# Patient Record
Sex: Female | Born: 1970 | Race: Black or African American | Hispanic: No | Marital: Married | Smoking: Never smoker
Health system: Southern US, Community
[De-identification: ages and names within clinical notes are randomized; demographics above are authoritative.]

## PROBLEM LIST (undated history)

## (undated) DIAGNOSIS — F329 Major depressive disorder, single episode, unspecified: Secondary | ICD-10-CM

## (undated) DIAGNOSIS — F419 Anxiety disorder, unspecified: Secondary | ICD-10-CM

## (undated) DIAGNOSIS — I10 Essential (primary) hypertension: Secondary | ICD-10-CM

## (undated) DIAGNOSIS — R7303 Prediabetes: Secondary | ICD-10-CM

## (undated) DIAGNOSIS — B9681 Helicobacter pylori [H. pylori] as the cause of diseases classified elsewhere: Secondary | ICD-10-CM

## (undated) DIAGNOSIS — K279 Peptic ulcer, site unspecified, unspecified as acute or chronic, without hemorrhage or perforation: Secondary | ICD-10-CM

## (undated) DIAGNOSIS — F32A Depression, unspecified: Secondary | ICD-10-CM

## (undated) HISTORY — PX: OOPHORECTOMY: SHX86

## (undated) HISTORY — PX: CHOLECYSTECTOMY: SHX55

## (undated) HISTORY — PX: ABDOMINAL HYSTERECTOMY: SHX81

## (undated) HISTORY — PX: APPENDECTOMY: SHX54

---

## 2006-12-30 ENCOUNTER — Inpatient Hospital Stay (HOSPITAL_COMMUNITY): Admission: EM | Admit: 2006-12-30 | Discharge: 2006-12-31 | Payer: Self-pay | Admitting: Emergency Medicine

## 2008-07-20 IMAGING — CR DG CHEST 1V PORT
1 series · 1 of 1 positions shown · non-contrast
Comparison: None available.

CLINICAL DATA: Chest pain and shortness of breath.
 PORTABLE CHEST - 1 VIEW 12/30/06:

[view not recorded]
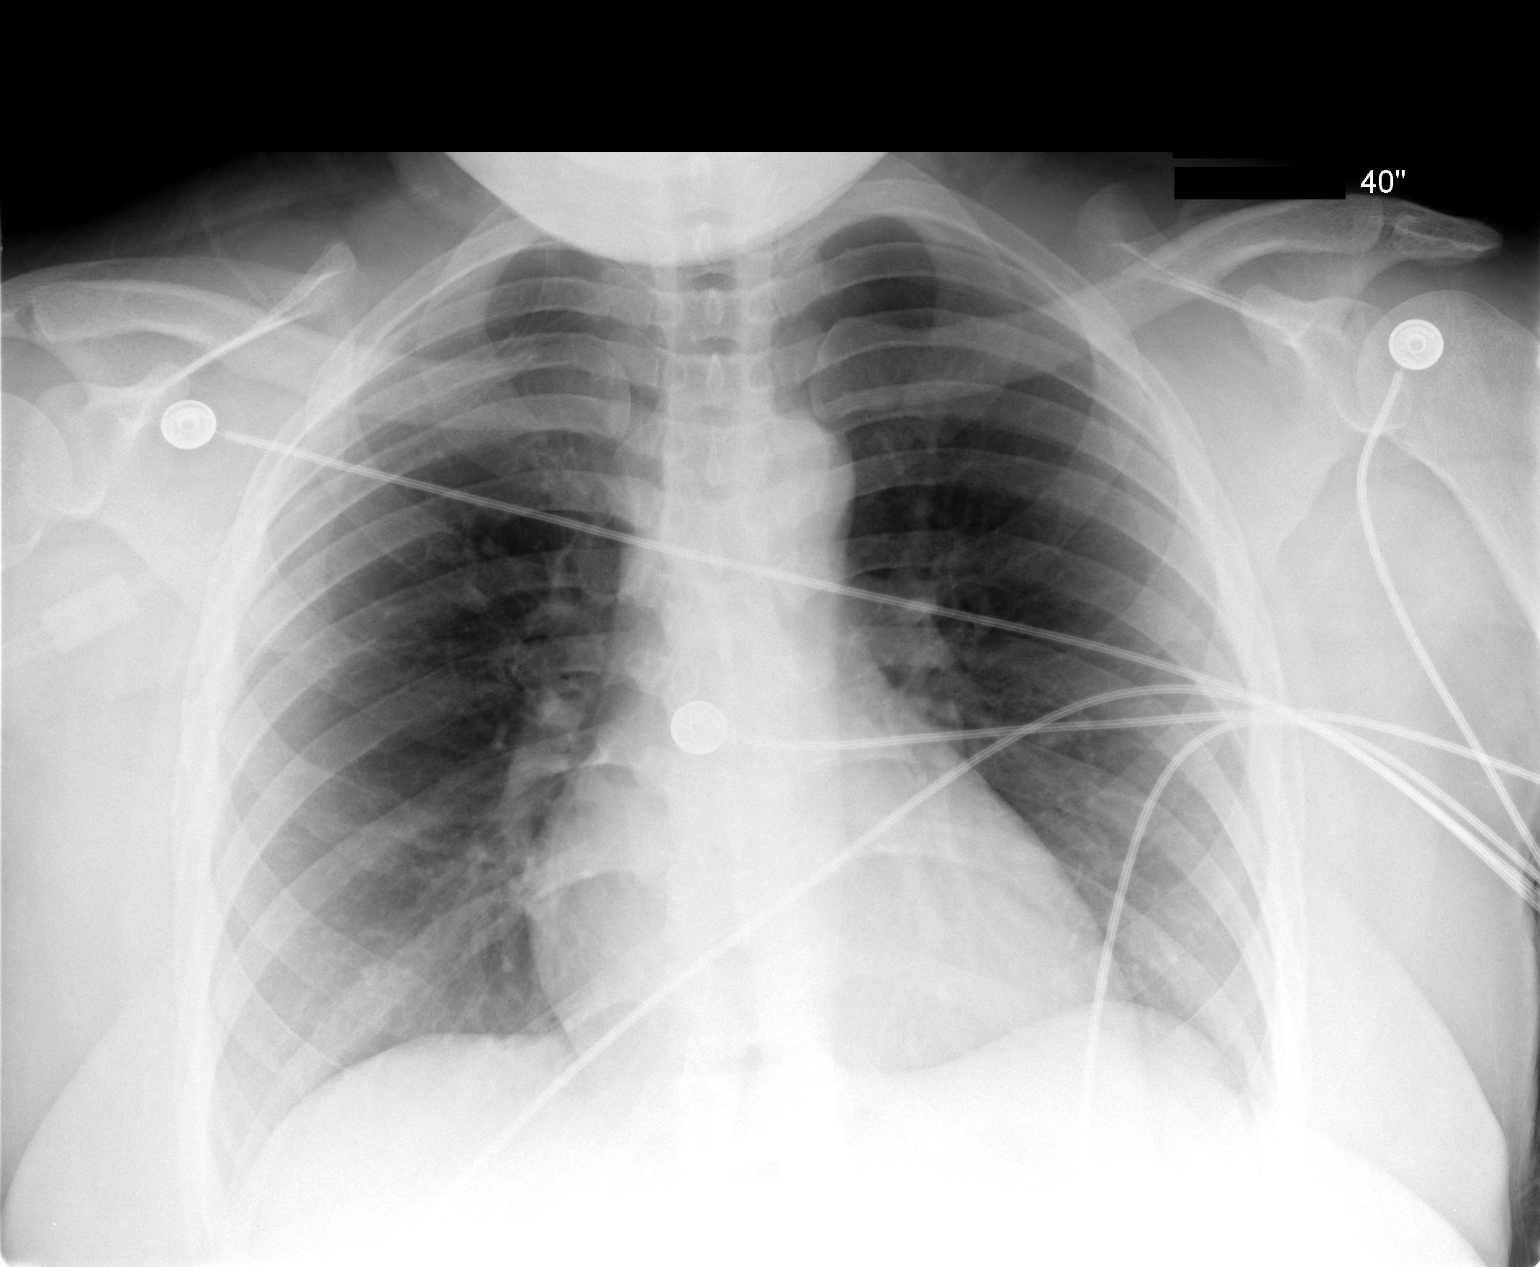

[1 of 1 positions shown; findings below may reference images not displayed]

FINDINGS: Trachea is midline.  Heart size is accentuated by technique.  Lungs are clear.  No pneumothorax.  No pleural fluid.
IMPRESSION: No acute findings.

## 2009-01-17 ENCOUNTER — Ambulatory Visit: Payer: Self-pay | Admitting: Cardiology

## 2009-01-17 ENCOUNTER — Observation Stay (HOSPITAL_COMMUNITY): Admission: EM | Admit: 2009-01-17 | Discharge: 2009-01-18 | Payer: Self-pay | Admitting: Emergency Medicine

## 2009-01-18 ENCOUNTER — Encounter: Payer: Self-pay | Admitting: Cardiology

## 2009-05-10 ENCOUNTER — Emergency Department (HOSPITAL_COMMUNITY)
Admission: EM | Admit: 2009-05-10 | Discharge: 2009-05-11 | Payer: Self-pay | Source: Home / Self Care | Admitting: Emergency Medicine

## 2009-09-18 ENCOUNTER — Ambulatory Visit (HOSPITAL_COMMUNITY): Admission: RE | Admit: 2009-09-18 | Discharge: 2009-09-18 | Payer: Self-pay | Admitting: Cardiology

## 2010-04-12 LAB — HM HEPATITIS C SCREENING LAB: HM Hepatitis Screen: NEGATIVE

## 2010-05-07 ENCOUNTER — Emergency Department (HOSPITAL_COMMUNITY)
Admission: EM | Admit: 2010-05-07 | Discharge: 2010-05-07 | Payer: Self-pay | Source: Home / Self Care | Admitting: Emergency Medicine

## 2010-07-14 LAB — DIFFERENTIAL
Basophils Absolute: 0 K/uL (ref 0.0–0.1)
Basophils Relative: 0 % (ref 0–1)
Eosinophils Absolute: 0 K/uL (ref 0.0–0.7)
Eosinophils Relative: 1 % (ref 0–5)
Lymphocytes Relative: 21 % (ref 12–46)
Lymphs Abs: 1 K/uL (ref 0.7–4.0)
Monocytes Absolute: 0.3 K/uL (ref 0.1–1.0)
Monocytes Relative: 5 % (ref 3–12)
Neutro Abs: 3.6 K/uL (ref 1.7–7.7)
Neutrophils Relative %: 72 % (ref 43–77)

## 2010-07-14 LAB — CBC
HCT: 35.9 % — ABNORMAL LOW (ref 36.0–46.0)
Hemoglobin: 12.1 g/dL (ref 12.0–15.0)
MCHC: 33.8 g/dL (ref 30.0–36.0)
MCV: 90.2 fL (ref 78.0–100.0)
Platelets: 279 K/uL (ref 150–400)
RBC: 3.98 MIL/uL (ref 3.87–5.11)
RDW: 13.5 % (ref 11.5–15.5)
WBC: 4.9 K/uL (ref 4.0–10.5)

## 2010-07-14 LAB — POCT I-STAT, CHEM 8
BUN: 9 mg/dL (ref 6–23)
Calcium, Ion: 1.14 mmol/L (ref 1.12–1.32)
Chloride: 105 meq/L (ref 96–112)
Creatinine, Ser: 0.7 mg/dL (ref 0.4–1.2)
Glucose, Bld: 95 mg/dL (ref 70–99)
HCT: 40 % (ref 36.0–46.0)
Hemoglobin: 13.6 g/dL (ref 12.0–15.0)
Potassium: 3.9 meq/L (ref 3.5–5.1)
Sodium: 139 meq/L (ref 135–145)
TCO2: 28 mmol/L (ref 0–100)

## 2010-07-14 LAB — POCT CARDIAC MARKERS
CKMB, poc: <1 ng/mL — ABNORMAL LOW (ref 1.0–8.0)
Myoglobin, poc: 36.8 ng/mL (ref 12–200)
Troponin i, poc: <0.05 ng/mL (ref 0.00–0.09)

## 2010-07-14 LAB — D-DIMER, QUANTITATIVE

## 2010-08-02 LAB — CBC
HCT: 37.4 % (ref 36.0–46.0)
MCV: 91.6 fL (ref 78.0–100.0)
Platelets: 298 10*3/uL (ref 150–400)
RDW: 13.2 % (ref 11.5–15.5)
WBC: 7 10*3/uL (ref 4.0–10.5)

## 2010-08-02 LAB — COMPREHENSIVE METABOLIC PANEL
ALT: 38 U/L — ABNORMAL HIGH (ref 0–35)
AST: 28 U/L (ref 0–37)
CO2: 28 mEq/L (ref 19–32)
Calcium: 9.7 mg/dL (ref 8.4–10.5)
Creatinine, Ser: 0.77 mg/dL (ref 0.4–1.2)
GFR calc Af Amer: >60 mL/min (ref >60)
Total Bilirubin: 0.6 mg/dL (ref 0.3–1.2)
Total Protein: 7.4 g/dL (ref 6.0–8.3)

## 2010-08-02 LAB — POCT CARDIAC MARKERS
CKMB, poc: 1.2 ng/mL (ref 1.0–8.0)
Myoglobin, poc: 60.6 ng/mL (ref 12–200)
Troponin i, poc: <0.05 ng/mL (ref 0.00–0.09)

## 2010-08-02 LAB — DIFFERENTIAL
Basophils Absolute: 0.1 10*3/uL (ref 0.0–0.1)
Basophils Relative: 1 % (ref 0–1)
Eosinophils Absolute: 0.2 10*3/uL (ref 0.0–0.7)
Eosinophils Relative: 3 % (ref 0–5)
Monocytes Absolute: 0.5 10*3/uL (ref 0.1–1.0)

## 2010-08-02 LAB — TROPONIN I: Troponin I: 0.02 ng/mL (ref 0.00–0.06)

## 2010-08-02 LAB — URINALYSIS, ROUTINE W REFLEX MICROSCOPIC
Bilirubin Urine: NEGATIVE
Hgb urine dipstick: NEGATIVE
Ketones, ur: NEGATIVE mg/dL
Specific Gravity, Urine: 1.018 (ref 1.005–1.030)
Urobilinogen, UA: 0.2 mg/dL (ref 0.0–1.0)
pH: 6 (ref 5.0–8.0)

## 2010-08-02 LAB — CK TOTAL AND CKMB (NOT AT ARMC)
CK, MB: 1.2 ng/mL (ref 0.3–4.0)
CK, MB: 1.3 ng/mL (ref 0.3–4.0)
Relative Index: 1 (ref 0.0–2.5)
Relative Index: INVALID (ref 0.0–2.5)
Total CK: 108 U/L (ref 7–177)
Total CK: 117 U/L (ref 7–177)
Total CK: 97 U/L (ref 7–177)

## 2010-09-10 NOTE — H&P (Signed)
Deanna Howe, THOMURE NO.:  [id number]   MEDICAL RECORD NO.:  [id number]          PATIENT TYPE:  EMS   LOCATION:  ED                           FACILITY:  [facility]   PHYSICIAN:  Elliot Cousin, M.D.    DATE OF BIRTH:  1971-03-17   DATE OF ADMISSION:  12/30/2006  DATE OF DISCHARGE:                              HISTORY & PHYSICAL   PRIMARY CARE PHYSICIAN:  Patient is unassigned.   CHIEF COMPLAINT:  Chest pressure and heart racing.   HISTORY OF PRESENT ILLNESS:  Patient is a 40 year old woman with a  remote history of hypertension and gastroesophageal reflux disease, who  presents to the emergency department with a chief complaint of chest  pressure and  heart racing.  Her symptoms started two days ago.  Over  the past 24 hours, the chest pressure has been constant.  It is located  substernally and over her central chest.  She rates the pain as an 8/10  in intensity.  It is associated with a feeling of her heart racing.  She  acknowledges chest palpitations.  The palpitations are worse when she  walks.  They resolve when she sits.  Occasionally, she has palpitations  when she is at rest.  The chest pressure is associated with occasional  shortness of breath. No associated radiation of the pressure and no  associated nausea or diaphoresis.  The patient denies pleurisy.  She has  mild chronic swelling in her ankles.  She denies any recent heavy  lifting.  She does acknowledge that she has occasional heartburn.  She  has had no cold-like symptoms or chronic cough.  She denies taking any  over-the-counter supplements.  She denies drinking excessive amounts of  coffee.  She denies illicit drug use.   The patient was given two sublingual nitroglycerin in the emergency  department, and it decreased her pain from an 8/10 in intensity to a  0/10 in intensity.  She is currently chest painfree.  She is  hemodynamically stable.  Her initial cardiac markers are negative.   Her  EKG reveals a sinus rhythm with occasional PVCs with a heart rate of  79 beats per minute.  The patient will be admitted for further  evaluation and management.   PAST MEDICAL HISTORY:  1. Treated for hypertension with atenolol from 2004 to 2007.  The      patient stopped atenolol because her blood pressure was normal.  2. Gastroesophageal reflux disease.  3. Status post C-sections x3.  4. Status post partial hysterectomy secondary to fibroids.  5. Status post right oophorectomy and status post appendectomy in      2006.  6. Status post left ovarian cyst excision.  7. Status post cholecystectomy in 2005.   MEDICATIONS:  None.   ALLERGIES:  The patient has allergies to MORPHINE, CODEINE, DILAUDID,  HYDROCODONE, OXYCODONE, PENICILLIN, and LATEX.   SOCIAL HISTORY:  The patient moved from [place], [place]  recently.  She is divorced.  She has three children.  She is currently  seeking employment.  She is trained as a Lawyer and  phlebotomist.  She  denies tobacco, alcohol, and illicit drug use.   FAMILY HISTORY:  Her mother is 30 years of age and has some type of  heart disease.  She also has a history of hypertension and diabetes  mellitus.  Her father is 67 years of age and has diabetes mellitus and  hypertension.   REVIEW OF SYSTEMS:  The patient's review of systems is positive for  occasional swelling in her ankles and heartburn; otherwise review of  systems is negative.   PHYSICAL EXAMINATION:  Temperature 97.8, blood pressure 118/75, pulse  75, respiratory rate 18, oxygen saturation 99% on room air.  GENERAL:  The patient is a pleasant 40 year old mildly obese African-  American woman who is currently sitting up in bed in no acute distress.  HEENT:  Head is normocephalic and atraumatic.  Pupils are equal, round  and reactive to light.  Extraocular movements are intact.  Conjunctivae  are clear.  Sclerae are white.  Tympanic membranes are clear  bilaterally.  Nasal mucosa  is mildly dry.  No sinus tenderness.  Oropharynx reveals good dentition.  Mucous membranes are mildly dry.  No  posterior exudates or erythema.  NECK:  Supple.  No adenopathy.  No thyromegaly.  No bruit.  No JVD.  LUNGS:  Clear to auscultation bilaterally.  HEART:  S1 and S2 with an occasional ectopic beat.  ABDOMEN:  Well-healed abdominal scar.  Mildly obese.  Positive bowel  sounds.  Soft, nontender, nondistended.  No hepatosplenomegaly.  No  masses palpated.  EXTREMITIES:  Pedal pulses palpable bilaterally.  No pretibial edema, no  pedal edema.  NEUROLOGIC:  Patient is alert and oriented x3.  Cranial nerves II-XII  are intact.   ADMISSION LABORATORIES:  Troponin I less than 0.05.  Myoglobin 44.8.  CK-  MB less than 1.  Sodium 138, potassium 3.7, chloride 106, CO2 28,  glucose 106, BUN 4, creatinine 0.70, calcium 9.  WBC 7.3, hemoglobin  11.8, platelets 351.  D-dimer 0.46.  Urine pregnancy test negative.  Urinalysis within normal limits.   ASSESSMENT:  1. Chest pressure with palpitations:  The patient probably does not      have underlying coronary artery disease; however, she will be      admitted to rule out myocardial infarction.  She will also be      evaluated with a 2D echocardiogram to rule out mitral valve      prolapse.  2. History of hypertension:  The patient's blood pressure is well      within normal limits.  However, given the history of present      illness and evidence of premature ventricular contractions on the      EKG, we will start atenolol empirically.  3. Mild normocytic anemia.  4. History of gastroesophageal reflux disease.   PLAN:  1. The patient will be admitted for further evaluation and management.  2. We will restart atenolol at 25 mg daily.  3. Will discontinue nitro paste as started by the emergency department      physician and start sublingual nitroglycerin as needed (The patient      requested that the nitroglycerin paste be taken off because  of a      moderate      headache).  4. Will start an aspirin, Protonix, and Mylanta.  5. Will check cardiac enzymes, TSH, 2D echocardiogram, and an urine      drug screen.      Elliot Cousin, M.D.  Electronically Signed     DF/MEDQ  D:  12/30/2006  T:  12/30/2006  Job:  [id number]

## 2010-09-10 NOTE — Discharge Summary (Signed)
Deanna Howe, Deanna Howe NO.:  [id number]   MEDICAL RECORD NO.:  [id number]          PATIENT TYPE:  INP   LOCATION:  1415                         FACILITY:  [facility]   PHYSICIAN:  Michaelyn Barter, M.D. DATE OF BIRTH:  September 30, 1970   DATE OF ADMISSION:  12/30/2006  DATE OF DISCHARGE:                               DISCHARGE SUMMARY   The patient's primary care doctor is unassigned.   FINAL DIAGNOSES:  1. Chest pain, atypical.  2. Palpitations.  3. Hypertension.   PROCEDURES:  1. Chest x-ray.  2. 2-D echocardiogram completed December 31, 2006.   HISTORY OF PRESENT ILLNESS:  Deanna Howe is a 40 year old female with a  history of hypertension and GERD.  She arrived with a chief complaint of  chest pressure and felt her heart racing.  She stated that her symptoms  started approximately 2 days prior to admission.  She indicates that  chest pressure has been constant and located substernally over the  central region of her chest pain.  She also stated that there was a  pain.  She rated it initially at an 8/10 in intensity.  She indicated  that she felt chest palpitations, which were worse when she walked and  resolved when she sat down.  She indicated that she had some occasional  shortness of breath.  There was no radiation of the pressure and no  associated diaphoresis.   PAST MEDICAL HISTORY:  Please see that dictated by Dr. Elliot Cousin.   HOSPITAL COURSE:  Problem 1.  CHEST PRESSURE:  After talking to the patient further, her  symptoms sound somewhat atypical in their description.  A chest x-ray  was completed on December 30, 2006, that revealed no acute findings.  The patient's EKG reveals sinus rhythm with occasional PVCs.  She was  admitted to the telemetry floor cardiac.  Her cardiac markers were  cycled.  Her troponin I were 0.02, 0.03 and 0.04.  Her CK-MBs were 1.0,  0.7 and 0.8,  By the day following her hospitalization she indicated  that her chest pain  had resolved.  She did have some slight chest  pressure.  Likewise, she indicated that she was no longer having any  palpitations.  I discussed setting the patient up for a stress test.  The patient refused to have a stress test.  She indicated that she had a  stress test approximately 3 years ago in [place], [place], and  that was normal.   Problem 2.  HYPERTENSION:  The patient's blood pressure has been  normotensive.  Atenolol was started.   CONDITION AT TIME OF DISCHARGE:  Currently the patient states that she  feels better.  Her temperature is 98.1.  Her heart rate 65, her  respirations 20, blood pressure 117/62, O2 saturation 96% on room air.  She looks otherwise comfortable.  She states that she wants to be  discharged home.  Again, I have offered to arrange the  patient to have a stress test even as an outpatient.  However, the  patient states that she does not want to have a  stress test.  Therefore,  the decision has been made to discharge the patient home as she has  requested.  She will be told to call [facility] in light of her not  having a primary care doctor to arrange for a follow-up appointment.      Michaelyn Barter, M.D.  Electronically Signed     OR/MEDQ  D:  12/31/2006  T:  01/01/2007  Job:  [id number]

## 2011-02-07 LAB — RAPID URINE DRUG SCREEN, HOSP PERFORMED
Amphetamines: NOT DETECTED
Benzodiazepines: NOT DETECTED
Cocaine: NOT DETECTED

## 2011-02-07 LAB — CK TOTAL AND CKMB (NOT AT ARMC)
Relative Index: 0.8
Relative Index: INVALID

## 2011-02-07 LAB — DIFFERENTIAL
Basophils Relative: 0
Eosinophils Relative: 3
Lymphocytes Relative: 31
Lymphs Abs: 2.2
Monocytes Relative: 9
Neutro Abs: 4.2
Neutrophils Relative %: 57

## 2011-02-07 LAB — URINALYSIS, ROUTINE W REFLEX MICROSCOPIC
Bilirubin Urine: NEGATIVE
Glucose, UA: NEGATIVE
Hgb urine dipstick: NEGATIVE
Ketones, ur: NEGATIVE
pH: 6

## 2011-02-07 LAB — BASIC METABOLIC PANEL
BUN: 4 — ABNORMAL LOW
CO2: 28
Calcium: 9
GFR calc Af Amer: >60
GFR calc non Af Amer: >60
Glucose, Bld: 106 — ABNORMAL HIGH

## 2011-02-07 LAB — B-NATRIURETIC PEPTIDE (CONVERTED LAB): Pro B Natriuretic peptide (BNP): <30

## 2011-02-07 LAB — CBC
HCT: 34.2 — ABNORMAL LOW
MCV: 88.5

## 2011-02-07 LAB — POCT CARDIAC MARKERS: Operator id: 4708

## 2011-02-07 LAB — TROPONIN I
Troponin I: 0.02
Troponin I: 0.03

## 2011-02-07 LAB — D-DIMER, QUANTITATIVE: D-Dimer, Quant: 0.46

## 2011-02-07 LAB — POCT PREGNANCY, URINE: Preg Test, Ur: NEGATIVE

## 2011-05-02 ENCOUNTER — Other Ambulatory Visit: Payer: Self-pay | Admitting: Obstetrics and Gynecology

## 2011-05-02 DIAGNOSIS — N6320 Unspecified lump in the left breast, unspecified quadrant: Secondary | ICD-10-CM

## 2011-05-02 DIAGNOSIS — N644 Mastodynia: Secondary | ICD-10-CM

## 2011-05-08 ENCOUNTER — Ambulatory Visit
Admission: RE | Admit: 2011-05-08 | Discharge: 2011-05-08 | Disposition: A | Discharge status: Home / Self Care | Payer: Managed Care, Other (non HMO) | Source: Ambulatory Visit | Attending: Obstetrics and Gynecology | Admitting: Obstetrics and Gynecology

## 2011-05-08 DIAGNOSIS — N6320 Unspecified lump in the left breast, unspecified quadrant: Secondary | ICD-10-CM

## 2011-05-08 DIAGNOSIS — N644 Mastodynia: Secondary | ICD-10-CM

## 2011-06-10 ENCOUNTER — Encounter (HOSPITAL_COMMUNITY): Payer: Self-pay

## 2011-06-10 ENCOUNTER — Emergency Department (HOSPITAL_COMMUNITY)
Admission: EM | Admit: 2011-06-10 | Discharge: 2011-06-10 | Discharge status: Against Medical Advice | Payer: Managed Care, Other (non HMO) | Attending: Emergency Medicine | Admitting: Emergency Medicine

## 2011-06-10 ENCOUNTER — Other Ambulatory Visit: Payer: Self-pay

## 2011-06-10 DIAGNOSIS — R079 Chest pain, unspecified: Secondary | ICD-10-CM | POA: Insufficient documentation

## 2011-06-10 HISTORY — DX: Anxiety disorder, unspecified: F41.9

## 2011-06-10 HISTORY — DX: Essential (primary) hypertension: I10

## 2011-06-10 NOTE — ED Notes (Signed)
C/o pressure to LT chest, behind breast radiating to LT shoulder and numbness to LT arm.  Denies shob but was nauseated earlier today.  Skin warm and dry.  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

## 2011-06-10 NOTE — ED Notes (Signed)
Pt refusing to stay any longer. Pt signed AMA form after being informed of the risks of leaving and the benefits of staying. Pt aware of these and signed AMA. Pt in no apparent distress upon leaving and alert and oriented. Pt encouraged to return if she needed to.

## 2012-08-20 ENCOUNTER — Encounter (HOSPITAL_COMMUNITY): Payer: Self-pay | Admitting: Pharmacist

## 2012-08-26 ENCOUNTER — Inpatient Hospital Stay (HOSPITAL_COMMUNITY): Admission: RE | Admit: 2012-08-26 | Payer: Managed Care, Other (non HMO) | Source: Ambulatory Visit

## 2012-09-01 ENCOUNTER — Other Ambulatory Visit: Payer: Self-pay | Admitting: Obstetrics and Gynecology

## 2012-09-01 ENCOUNTER — Encounter (HOSPITAL_COMMUNITY)
Admission: RE | Admit: 2012-09-01 | Discharge: 2012-09-01 | Disposition: A | Discharge status: Home / Self Care | Payer: Managed Care, Other (non HMO) | Source: Ambulatory Visit | Attending: Obstetrics and Gynecology | Admitting: Obstetrics and Gynecology

## 2012-09-01 ENCOUNTER — Encounter (HOSPITAL_COMMUNITY): Payer: Self-pay

## 2012-09-01 DIAGNOSIS — R109 Unspecified abdominal pain: Secondary | ICD-10-CM

## 2012-09-01 HISTORY — DX: Peptic ulcer, site unspecified, unspecified as acute or chronic, without hemorrhage or perforation: K27.9

## 2012-09-01 HISTORY — DX: Depression, unspecified: F32.A

## 2012-09-01 HISTORY — DX: Major depressive disorder, single episode, unspecified: F32.9

## 2012-09-01 HISTORY — DX: Helicobacter pylori (H. pylori) as the cause of diseases classified elsewhere: B96.81

## 2012-09-01 LAB — BASIC METABOLIC PANEL
BUN: 7 mg/dL (ref 6–23)
Chloride: 103 mEq/L (ref 96–112)
Glucose, Bld: 101 mg/dL — ABNORMAL HIGH (ref 70–99)
Potassium: 4.2 mEq/L (ref 3.5–5.1)

## 2012-09-01 LAB — CBC
HCT: 37.7 % (ref 36.0–46.0)
Hemoglobin: 12.6 g/dL (ref 12.0–15.0)
MCHC: 33.4 g/dL (ref 30.0–36.0)
WBC: 5.5 10*3/uL (ref 4.0–10.5)

## 2012-09-01 NOTE — Patient Instructions (Addendum)
20 Deanna Howe  09/01/2012   Your procedure is scheduled on:  09/06/12  Enter through the Main Entrance of [facility] at 6 AM.  Pick up the phone at the desk and dial [phone].   Call this number if you have problems the morning of surgery: [phone]   Remember:   Do not eat food:After Midnight.  Do not drink clear liquids: After Midnight.  Take these medicines the morning of surgery with A SIP OF WATER: Celexa, Wellbutrin, blood pressure medication.   Do not wear jewelry, make-up or nail polish.  Do not wear lotions, powders, or perfumes. You may wear deodorant.  Do not shave 48 hours prior to surgery.  Do not bring valuables to the hospital.  Contacts, dentures or bridgework may not be worn into surgery.  Leave suitcase in the car. After surgery it may be brought to your room.  For patients admitted to the hospital, checkout time is 11:00 AM the day of discharge.   Patients discharged the day of surgery will not be allowed to drive home.  Name and phone number of your driver: NA  Special Instructions: Shower using CHG 2 nights before surgery and the night before surgery.  If you shower the day of surgery use CHG.  Use special wash - you have one bottle of CHG for all showers.  You should use approximately 1/3 of the bottle for each shower.   Please read over the following fact sheets that you were given: MRSA Information

## 2012-09-02 ENCOUNTER — Ambulatory Visit
Admission: RE | Admit: 2012-09-02 | Discharge: 2012-09-02 | Disposition: A | Discharge status: Home / Self Care | Payer: Managed Care, Other (non HMO) | Source: Ambulatory Visit | Attending: Obstetrics and Gynecology | Admitting: Obstetrics and Gynecology

## 2012-09-02 DIAGNOSIS — R109 Unspecified abdominal pain: Secondary | ICD-10-CM

## 2012-09-06 ENCOUNTER — Encounter (HOSPITAL_COMMUNITY)
Admission: RE | Disposition: A | Discharge status: Home / Self Care | Payer: Self-pay | Source: Ambulatory Visit | Attending: Obstetrics and Gynecology

## 2012-09-06 ENCOUNTER — Encounter (HOSPITAL_COMMUNITY): Payer: Self-pay | Admitting: Anesthesiology

## 2012-09-06 ENCOUNTER — Ambulatory Visit (HOSPITAL_COMMUNITY)
Admission: RE | Admit: 2012-09-06 | Discharge: 2012-09-06 | Disposition: A | Discharge status: Home / Self Care | Payer: Managed Care, Other (non HMO) | Source: Ambulatory Visit | Attending: Obstetrics and Gynecology | Admitting: Obstetrics and Gynecology

## 2012-09-06 ENCOUNTER — Ambulatory Visit (HOSPITAL_COMMUNITY): Payer: Managed Care, Other (non HMO) | Admitting: Anesthesiology

## 2012-09-06 DIAGNOSIS — I10 Essential (primary) hypertension: Secondary | ICD-10-CM | POA: Insufficient documentation

## 2012-09-06 DIAGNOSIS — R1032 Left lower quadrant pain: Secondary | ICD-10-CM | POA: Insufficient documentation

## 2012-09-06 DIAGNOSIS — N949 Unspecified condition associated with female genital organs and menstrual cycle: Secondary | ICD-10-CM | POA: Insufficient documentation

## 2012-09-06 DIAGNOSIS — N83209 Unspecified ovarian cyst, unspecified side: Secondary | ICD-10-CM | POA: Insufficient documentation

## 2012-09-06 HISTORY — PX: INCISION AND DRAINAGE: SHX5863

## 2012-09-06 HISTORY — PX: LAPAROSCOPY: SHX197

## 2012-09-06 SURGERY — LAPAROSCOPY OPERATIVE
Anesthesia: General | Site: Abdomen | Wound class: Clean Contaminated

## 2012-09-06 MED ORDER — DEXAMETHASONE SODIUM PHOSPHATE 10 MG/ML IJ SOLN
INTRAMUSCULAR | Status: DC | PRN
  Administered 2012-09-06: 10 mg via INTRAVENOUS

## 2012-09-06 MED ORDER — ACETAMINOPHEN 160 MG/5ML PO SOLN: 975.0000 mg | Freq: Once | ORAL | Status: DC

## 2012-09-06 MED ORDER — KETOROLAC TROMETHAMINE 30 MG/ML IJ SOLN
INTRAMUSCULAR | Status: DC | PRN
  Administered 2012-09-06: 30 mg via INTRAVENOUS

## 2012-09-06 MED ORDER — LACTATED RINGERS IR SOLN
Status: DC | PRN
  Administered 2012-09-06: 3000 mL

## 2012-09-06 MED ORDER — SCOPOLAMINE 1 MG/3DAYS TD PT72: 1.0000 | MEDICATED_PATCH | TRANSDERMAL | Status: DC

## 2012-09-06 MED ORDER — MIDAZOLAM HCL 2 MG/2ML IJ SOLN
INTRAMUSCULAR | Status: AC
  Filled 2012-09-06: qty 2

## 2012-09-06 MED ORDER — GLYCOPYRROLATE 0.2 MG/ML IJ SOLN
INTRAMUSCULAR | Status: AC
  Filled 2012-09-06: qty 3

## 2012-09-06 MED ORDER — FENTANYL CITRATE 0.05 MG/ML IJ SOLN
INTRAMUSCULAR | Status: DC | PRN
  Administered 2012-09-06 (×5): 50 ug via INTRAVENOUS

## 2012-09-06 MED ORDER — METOCLOPRAMIDE HCL 5 MG/ML IJ SOLN: 10.0000 mg | Freq: Once | INTRAMUSCULAR | Status: DC | PRN

## 2012-09-06 MED ORDER — NEOSTIGMINE METHYLSULFATE 1 MG/ML IJ SOLN
INTRAMUSCULAR | Status: AC
  Filled 2012-09-06: qty 1

## 2012-09-06 MED ORDER — FENTANYL CITRATE 0.05 MG/ML IJ SOLN
25.0000 ug | INTRAMUSCULAR | Status: DC | PRN
  Administered 2012-09-06: 50 ug via INTRAVENOUS

## 2012-09-06 MED ORDER — BUPIVACAINE HCL (PF) 0.25 % IJ SOLN
INTRAMUSCULAR | Status: AC
  Filled 2012-09-06: qty 30

## 2012-09-06 MED ORDER — NEOSTIGMINE METHYLSULFATE 1 MG/ML IJ SOLN
INTRAMUSCULAR | Status: DC | PRN
  Administered 2012-09-06: 5 mg via INTRAVENOUS

## 2012-09-06 MED ORDER — SCOPOLAMINE 1 MG/3DAYS TD PT72
MEDICATED_PATCH | TRANSDERMAL | Status: AC
  Administered 2012-09-06: 1.5 mg via TRANSDERMAL
  Filled 2012-09-06: qty 1

## 2012-09-06 MED ORDER — GLYCOPYRROLATE 0.2 MG/ML IJ SOLN
INTRAMUSCULAR | Status: DC | PRN
  Administered 2012-09-06: .8 mg via INTRAVENOUS

## 2012-09-06 MED ORDER — FENTANYL CITRATE 0.05 MG/ML IJ SOLN
INTRAMUSCULAR | Status: AC
  Administered 2012-09-06: 50 ug via INTRAVENOUS
  Filled 2012-09-06: qty 2

## 2012-09-06 MED ORDER — PROPOFOL 10 MG/ML IV EMUL
INTRAVENOUS | Status: AC
  Filled 2012-09-06: qty 20

## 2012-09-06 MED ORDER — ONDANSETRON HCL 4 MG/2ML IJ SOLN
INTRAMUSCULAR | Status: DC | PRN
  Administered 2012-09-06: 4 mg via INTRAVENOUS

## 2012-09-06 MED ORDER — DIPHENHYDRAMINE HCL 50 MG/ML IJ SOLN
INTRAMUSCULAR | Status: DC | PRN
  Administered 2012-09-06: 50 mg via INTRAVENOUS

## 2012-09-06 MED ORDER — MIDAZOLAM HCL 5 MG/5ML IJ SOLN
INTRAMUSCULAR | Status: DC | PRN
  Administered 2012-09-06: 2 mg via INTRAVENOUS

## 2012-09-06 MED ORDER — ACETAMINOPHEN 160 MG/5 ML PO SOLN
975.0000 mg | Freq: Four times a day (QID) | ORAL | Status: DC | PRN
  Filled 2012-09-06: qty 30.4

## 2012-09-06 MED ORDER — DEXAMETHASONE SODIUM PHOSPHATE 10 MG/ML IJ SOLN
INTRAMUSCULAR | Status: AC
  Filled 2012-09-06: qty 1

## 2012-09-06 MED ORDER — ACETAMINOPHEN 10 MG/ML IV SOLN: 1000.0000 mg | Freq: Once | INTRAVENOUS | Status: AC | PRN

## 2012-09-06 MED ORDER — FENTANYL CITRATE 0.05 MG/ML IJ SOLN
INTRAMUSCULAR | Status: AC
  Filled 2012-09-06: qty 5

## 2012-09-06 MED ORDER — NALBUPHINE SYRINGE 5 MG/0.5 ML
INJECTION | INTRAMUSCULAR | Status: AC
  Filled 2012-09-06: qty 0.5

## 2012-09-06 MED ORDER — KETOROLAC TROMETHAMINE 30 MG/ML IJ SOLN
INTRAMUSCULAR | Status: AC
  Filled 2012-09-06: qty 1

## 2012-09-06 MED ORDER — LACTATED RINGERS IV SOLN
INTRAVENOUS | Status: DC
  Administered 2012-09-06 (×3): via INTRAVENOUS

## 2012-09-06 MED ORDER — GLYCOPYRROLATE 0.2 MG/ML IJ SOLN
INTRAMUSCULAR | Status: AC
  Filled 2012-09-06: qty 1

## 2012-09-06 MED ORDER — IBUPROFEN 200 MG PO TABS: 600.0000 mg | ORAL_TABLET | Freq: Four times a day (QID) | ORAL | Status: DC | PRN

## 2012-09-06 MED ORDER — LIDOCAINE HCL (CARDIAC) 20 MG/ML IV SOLN
INTRAVENOUS | Status: DC | PRN
  Administered 2012-09-06: 80 mg via INTRAVENOUS

## 2012-09-06 MED ORDER — BUPIVACAINE HCL (PF) 0.25 % IJ SOLN
INTRAMUSCULAR | Status: DC | PRN
  Administered 2012-09-06: 2 mL

## 2012-09-06 MED ORDER — ACETAMINOPHEN 160 MG/5ML PO SOLN
ORAL | Status: AC
  Administered 2012-09-06: 975 mg via ORAL
  Filled 2012-09-06: qty 20.3

## 2012-09-06 MED ORDER — ROCURONIUM BROMIDE 50 MG/5ML IV SOLN
INTRAVENOUS | Status: AC
  Filled 2012-09-06: qty 1

## 2012-09-06 MED ORDER — ROCURONIUM BROMIDE 100 MG/10ML IV SOLN
INTRAVENOUS | Status: DC | PRN
  Administered 2012-09-06: 50 mg via INTRAVENOUS

## 2012-09-06 MED ORDER — KETOROLAC TROMETHAMINE 30 MG/ML IJ SOLN: 15.0000 mg | Freq: Once | INTRAMUSCULAR | Status: DC | PRN

## 2012-09-06 MED ORDER — ACETAMINOPHEN 10 MG/ML IV SOLN
INTRAVENOUS | Status: AC
  Administered 2012-09-06: 1000 mg via INTRAVENOUS
  Filled 2012-09-06: qty 100

## 2012-09-06 MED ORDER — PROPOFOL 10 MG/ML IV BOLUS
INTRAVENOUS | Status: DC | PRN
  Administered 2012-09-06: 200 mg via INTRAVENOUS

## 2012-09-06 MED ORDER — NALBUPHINE SYRINGE 5 MG/0.5 ML
2.5000 mg | INJECTION | Freq: Once | INTRAMUSCULAR | Status: AC
  Administered 2012-09-06: 2.5 mg via INTRAVENOUS

## 2012-09-06 MED ORDER — DEXTROSE 5 % IV SOLN
2.0000 g | Freq: Once | INTRAVENOUS | Status: AC
  Administered 2012-09-06: 2 g via INTRAVENOUS
  Filled 2012-09-06: qty 2

## 2012-09-06 MED ORDER — LIDOCAINE HCL (CARDIAC) 20 MG/ML IV SOLN
INTRAVENOUS | Status: AC
  Filled 2012-09-06: qty 5

## 2012-09-06 MED ORDER — DIPHENHYDRAMINE HCL 50 MG/ML IJ SOLN
INTRAMUSCULAR | Status: AC
  Filled 2012-09-06: qty 1

## 2012-09-06 MED ORDER — ONDANSETRON HCL 4 MG/2ML IJ SOLN
INTRAMUSCULAR | Status: AC
  Filled 2012-09-06: qty 2

## 2012-09-06 SURGICAL SUPPLY — 30 items
BARRIER ADHS 3X4 INTERCEED (GAUZE/BANDAGES/DRESSINGS) IMPLANT
CABLE HIGH FREQUENCY MONO STRZ (ELECTRODE) IMPLANT
CATH ROBINSON RED A/P 16FR (CATHETERS) ×4 IMPLANT
CHLORAPREP W/TINT 26ML (MISCELLANEOUS) ×8 IMPLANT
CLOTH BEACON ORANGE TIMEOUT ST (SAFETY) ×4 IMPLANT
DERMABOND ADVANCED (GAUZE/BANDAGES/DRESSINGS) ×1
DERMABOND ADVANCED .7 DNX12 (GAUZE/BANDAGES/DRESSINGS) ×3 IMPLANT
DRSG COVADERM PLUS 2X2 (GAUZE/BANDAGES/DRESSINGS) ×4 IMPLANT
GLOVE BIO SURGEON STRL SZ 6.5 (GLOVE) ×4 IMPLANT
GLOVE BIOGEL PI IND STRL 7.0 (GLOVE) ×3 IMPLANT
GLOVE BIOGEL PI INDICATOR 7.0 (GLOVE) ×1
GOWN PREVENTION PLUS LG XLONG (DISPOSABLE) ×8 IMPLANT
NS IRRIG 1000ML POUR BTL (IV SOLUTION) ×4 IMPLANT
PACK LAPAROSCOPY BASIN (CUSTOM PROCEDURE TRAY) ×4 IMPLANT
POUCH SPECIMEN RETRIEVAL 10MM (ENDOMECHANICALS) IMPLANT
PROTECTOR NERVE ULNAR (MISCELLANEOUS) ×4 IMPLANT
SEALER TISSUE G2 CVD JAW 35 (ENDOMECHANICALS) IMPLANT
SEALER TISSUE G2 CVD JAW 45CM (ENDOMECHANICALS) IMPLANT
SET IRRIG TUBING LAPAROSCOPIC (IRRIGATION / IRRIGATOR) ×4 IMPLANT
STRIP CLOSURE SKIN 1/2X4 (GAUZE/BANDAGES/DRESSINGS) IMPLANT
SUT VIC AB 3-0 PS2 18 (SUTURE) ×1
SUT VIC AB 3-0 PS2 18XBRD (SUTURE) ×3 IMPLANT
SUT VICRYL 0 UR6 27IN ABS (SUTURE) ×4 IMPLANT
TAPE CLOTH SURG 4X10 WHT LF (GAUZE/BANDAGES/DRESSINGS) ×4 IMPLANT
TOWEL OR 17X24 6PK STRL BLUE (TOWEL DISPOSABLE) ×8 IMPLANT
TROCAR OPTI TIP 5M 100M (ENDOMECHANICALS) ×4 IMPLANT
TROCAR XCEL NON-BLD 11X100MML (ENDOMECHANICALS) ×4 IMPLANT
TROCAR XCEL OPT SLVE 5M 100M (ENDOMECHANICALS) IMPLANT
WARMER LAPAROSCOPE (MISCELLANEOUS) ×4 IMPLANT
WATER STERILE IRR 1000ML POUR (IV SOLUTION) ×4 IMPLANT

## 2012-09-06 NOTE — Anesthesia Preprocedure Evaluation (Signed)
Anesthesia Evaluation  Patient identified by MRN, date of birth, ID band Patient awake    Reviewed: Allergy & Precautions, H&P , NPO status , Patient's Chart, lab work & pertinent test results, reviewed documented beta blocker date and time   Airway Mallampati: III TM Distance: >3 FB Neck ROM: full    Dental  (+) Teeth Intact Wisdom teeth out 3 weeks ago:   Pulmonary neg pulmonary ROS,  breath sounds clear to auscultation  Pulmonary exam normal       Cardiovascular Exercise Tolerance: Good hypertension (HCTZ), Rhythm:regular Rate:Normal     Neuro/Psych PSYCHIATRIC DISORDERS (anxiety and depression) negative neurological ROS     GI/Hepatic Neg liver ROS, PUD,   Endo/Other  Morbid obesity  Renal/GU negative Renal ROS  Female GU complaint     Musculoskeletal   Abdominal   Peds  Hematology negative hematology ROS (+)   Anesthesia Other Findings Allergy to codeine, dilaudid and hydrocodone - all cause itching.  Reports allergy to "all narcotics".  Understands she will need some narcotic medication for anesthesia and surgery.  We will use fentanyl and premedicate with benadryl.  Reproductive/Obstetrics negative OB ROS                           Anesthesia Physical Anesthesia Plan  ASA: III  Anesthesia Plan: General ETT   Post-op Pain Management:    Induction:   Airway Management Planned:   Additional Equipment:   Intra-op Plan:   Post-operative Plan:   Informed Consent: I have reviewed the patients History and Physical, chart, labs and discussed the procedure including the risks, benefits and alternatives for the proposed anesthesia with the patient or authorized representative who has indicated his/her understanding and acceptance.   Dental Advisory Given  Plan Discussed with: CRNA and Surgeon  Anesthesia Plan Comments:         Anesthesia Quick Evaluation

## 2012-09-06 NOTE — H&P (Signed)
42 yo with LLQ pain x 1 mo presents for surgical mngt.  PV [place] in April shows 1.6cm hemorrhagic left ovarian cyst.  CT abd/pv last week shows resolution of cyst but pt reprots pain persists.  Presents for diagnostic laparoscopy  PMHx:  HTN PSHx:  vagianl hyst, l/s RSO, l/s chole/appy SHx:  Negative tobacco All:  Narcotics, PCN Meds:  HCTZ  AF, VSS CV - RRR Lungs - clear Abd - soft, NT/ND PV - uterus absent, pain LLQ, no palp masses  [place] - left ovary w/ 1.6cm hemorrhagic cyst vs dermoid CT scan neg  A/P:  LLQ pain Diagnostic l/s with possible ovarian cystectomy, possible LSO

## 2012-09-06 NOTE — Anesthesia Procedure Notes (Signed)
Procedure Name: Intubation Date/Time: 09/06/2012 7:37 AM Performed by: Graciela Husbands Pre-anesthesia Checklist: Emergency Drugs available, Timeout performed, Suction available, Patient identified and Patient being monitored Patient Re-evaluated:Patient Re-evaluated prior to inductionOxygen Delivery Method: Circle system utilized Preoxygenation: Pre-oxygenation with 100% oxygen Intubation Type: IV induction Ventilation: Mask ventilation without difficulty Laryngoscope Size: Mac and 3 Grade View: Grade I Tube size: 7.0 mm Number of attempts: 1 Airway Equipment and Method: Patient positioned with wedge pillow and Stylet Placement Confirmation: ETT inserted through vocal cords under direct vision,  positive ETCO2 and breath sounds checked- equal and bilateral Secured at: 22 cm Tube secured with: Tape Dental Injury: Teeth and Oropharynx as per pre-operative assessment

## 2012-09-06 NOTE — Transfer of Care (Signed)
Immediate Anesthesia Transfer of Care Note  Patient: Deanna Howe  Procedure(s) Performed: Procedure(s) with comments: LAPAROSCOPY OPERATIVE (N/A) INCISION AND DRAINAGE (Left) - of ovarian cyst  Patient Location: PACU  Anesthesia Type:General  Level of Consciousness: awake, alert  and oriented  Airway & Oxygen Therapy: Patient Spontanous Breathing and Patient connected to nasal cannula oxygen  Post-op Assessment: Report given to PACU RN and Post -op Vital signs reviewed and stable  Post vital signs: Reviewed and stable  Complications: No apparent anesthesia complications

## 2012-09-06 NOTE — Anesthesia Postprocedure Evaluation (Signed)
  Anesthesia Post-op Note  Patient: Deanna Howe  Procedure(s) Performed: Procedure(s) with comments: LAPAROSCOPY OPERATIVE (N/A) INCISION AND DRAINAGE (Left) - of ovarian cyst  Patient Location: PACU  Anesthesia Type:General  Level of Consciousness: awake, alert  and oriented  Airway and Oxygen Therapy: Patient Spontanous Breathing  Post-op Pain: none  Post-op Assessment: Post-op Vital signs reviewed, Patient's Cardiovascular Status Stable, Respiratory Function Stable, Patent Airway, No signs of Nausea or vomiting and Pain level controlled  Post-op Vital Signs: Reviewed and stable  Complications: No apparent anesthesia complications

## 2012-09-07 ENCOUNTER — Encounter (HOSPITAL_COMMUNITY): Payer: Self-pay | Admitting: Obstetrics and Gynecology

## 2012-09-07 NOTE — Op Note (Signed)
Deanna Howe, Deanna Howe NO.:  [id number]  MEDICAL RECORD NO.:  [id number]  LOCATION:  [facility]                          FACILITY:  [facility]  PHYSICIAN:  Zelphia Cairo, MD    DATE OF BIRTH:  11-21-1970  DATE OF PROCEDURE:  09/06/2012 DATE OF DISCHARGE:  09/06/2012                              OPERATIVE REPORT   PREOPERATIVE DIAGNOSIS:  Pelvic pain.  POSTOPERATIVE DIAGNOSES: 1. Pelvic pain. 2. Left ovarian cyst. 3. Adhesions. 4. Pelvic free fluid.  PROCEDURES: 1. Diagnostic laparoscopy. 2. Lysis of adhesions. 3. Incision and drainage of left ovarian cyst.  SURGEON:  Zelphia Cairo, MD  ANESTHESIA:  General.  SPECIMENS:  None.  COMPLICATIONS:  None.  CONDITION:  Stable to recovery room.  PROCEDURE:  The patient was taken to the operating room after informed consent was obtained.  She was given general anesthesia and placed in the dorsal lithotomy position using Allen stirrups.  She was prepped and draped in sterile fashion and an in-and-out catheter was used to drain her bladder for a small unmeasured amount of urine.  A 0.25% Marcaine was used to provide local anesthesia at the site of the infraumbilical and suprapubic skin incision.  Skin incision was made with a scalpel and extended bluntly to the level of the fascia.  Optical trocar was inserted under direct visualization.  Survey of the abdomen and pelvis was performed.  Right upper quadrant appeared normal.  Gallbladder was absent.  She did have some dense adhesions of the left ovary and fallopian tube to the left pelvic sidewall and vaginal cuff.  A small cystic lesion was noted between the ovary and colon, and small amount of free fluid was noted in the pelvis on inspection.  At this time, a small suprapubic incision was made with a scalpel and a 5-mm trocar was inserted under direct visualization.  The colon was retracted and laparoscopic scissors were used to transect a small band of  adhesions between the left ovary and vaginal cuff.  At this point, the cyst could be grasped and tented upwards.  The cyst was draining spontaneously.  A large incision was made in the cystic structure and sanguinous drainage was noted.  The pelvis was then copiously suction irrigated.  Hemostasis was achieved where the left ovary continued to have some dense adhesions of the left ovary.  This was not felt to be a source of her pain and I did not want to remove her ovary given her young age.  All instruments were then removed from the pelvis.  A deep stitch was plated in the infraumbilical incision.  The skin was reapproximated with Vicryl and Dermabond was placed over both incisions.  She was taken to the recovery room in stable condition.  Sponge, lap, needle, and instrument counts were correct x2.     Zelphia Cairo, MD     GA/MEDQ  D:  09/06/2012  T:  09/07/2012  Job:  [id number]

## 2012-12-31 DIAGNOSIS — F329 Major depressive disorder, single episode, unspecified: Secondary | ICD-10-CM | POA: Insufficient documentation

## 2013-02-21 DIAGNOSIS — F988 Other specified behavioral and emotional disorders with onset usually occurring in childhood and adolescence: Secondary | ICD-10-CM | POA: Insufficient documentation

## 2013-06-09 DIAGNOSIS — H409 Unspecified glaucoma: Secondary | ICD-10-CM | POA: Insufficient documentation

## 2013-10-25 DIAGNOSIS — K219 Gastro-esophageal reflux disease without esophagitis: Secondary | ICD-10-CM | POA: Insufficient documentation

## 2014-05-15 ENCOUNTER — Ambulatory Visit (INDEPENDENT_AMBULATORY_CARE_PROVIDER_SITE_OTHER): Payer: Managed Care, Other (non HMO) | Admitting: Podiatry

## 2014-05-15 DIAGNOSIS — B353 Tinea pedis: Secondary | ICD-10-CM

## 2014-05-15 DIAGNOSIS — L600 Ingrowing nail: Secondary | ICD-10-CM

## 2014-05-15 DIAGNOSIS — B351 Tinea unguium: Secondary | ICD-10-CM

## 2014-05-15 NOTE — Patient Instructions (Signed)

## 2014-05-15 NOTE — Progress Notes (Addendum)
Subjective:    Patient ID: Deanna Howe, female    DOB: [birth year], 44 y.o.   MRN: [id number]  HPI Comments: "I have some sore toes"  44 year old female presents the office of complaints of pain to bilateral hallux nails for which she points the medial nail borders. She states of the right is worse than the left. She states there is an ongoing for several months and has been progressive. She states that she periodically will try to trim out the ingrown portion of the toenail however it has not been helping. She denies any recent redness or drainage from the nail sites. She has a states that she has some itching to the bottom of her feet and in her feet sweat a lot. She said no treatment for this. No other complaints at this time.  Toe Pain       Review of Systems  Constitutional: Positive for appetite change and fatigue.  Gastrointestinal: Positive for constipation.  Musculoskeletal: Positive for myalgias, back pain, arthralgias and gait problem.  Allergic/Immunologic: Positive for environmental allergies.       Objective:   Physical Exam AAO 3, NAD DP/PT pulses palpable, CRT less than 3 seconds Protective sensation intact with Simms Weinstein monofilament, vibratory sensation intact, Achilles tendon reflex intact. There is tenderness along the right medial hallux nail border with evidence of incurvation along the medial nail border. There is no surrounding erythema, edema, drainage/purulence. There is mild discomfort to the distal medial aspect of the left hallux nail with mild evidence of incurvation. The nails are hypertrophic, dystrophic, discolored without any surrounding erythema or drainage. There is mild evidence of dry, peeling skin with a mild erythematous base consistent with tinea pedis to the plantar feet. No open lesions or pre-ulcer lesions identified. No pain with calf compression, swelling, warmth, erythema. No other areas of tenderness to bilateral lower  extremities and there is no overlying edema, erythema, increase in warmth bilaterally.       Assessment & Plan:  44 year old female with bilateral medial hallux symptomatically ingrown toenail, mild tinea pedis -Treatment options both conservative and surgical were discussed including alternatives, risks, complications. -Due to the symptomatic ingrown toenail recommended partial nail avulsion with chemical matricectomy in order to help prevent recurrence of the ingrown toenail. This time she lacks only proceed with the right side of the left is not as symptomatic. Risks and complications were discussed with this for which the patient understands that she verbally consents to the procedure. Sterile conditions a total of 2.5 mL of a one-to-one mixture of 2% lidocaine plain and 0.5% Marcaine plain was infiltrated in a hallux block fashion on the right hallux. An additional 2 mL of 2% lidocaine plain was infiltrated to ensure anesthesia. Once anesthetized the right hallux was then prepped in a sterile fashion. A tourniquet was then applied. Next the medial border of the right hallux nail was then sharply excised making sure to remove the entire offending nail border. There is found to be a significant amount of ingrowing along the medial nail border. No purulence or signs of infection was noted. Once the nail was removed the area was debrided. Phenol was then applied under standard conditions and then copiously irrigated. Silvadene was applied followed by dry sterile dressing. After application of the dressing the tourniquet was removed and there is found to be an immediate capillary refill time to the digit. The patient tolerated the procedure well without any complications. Post procedure instructions were discussed the patient for  which she verbally understood. Dispensed surgical shoe per patients request.  -Right medial hallux nail border was sharply debrided without complication/bleeding. -For tinea pedis  she states that she has Lotrisone cream at home although she has not used it. Recommended her to use this. Also discussed ways to help decrease the sweating.  -Follow-up in one week for nail check or sooner should any problems arise. In the meantime, encouraged call the office with any questions, concerns, change in symptoms. Follow-up with PCP for other issues mentioned in the ROS.

## 2014-05-17 ENCOUNTER — Telehealth: Payer: Self-pay | Admitting: *Deleted

## 2014-05-17 MED ORDER — TRAMADOL HCL 50 MG PO TABS: 50.0000 mg | ORAL_TABLET | Freq: Two times a day (BID) | ORAL | Status: DC

## 2014-05-17 NOTE — Telephone Encounter (Addendum)
Pt states she had ingrown toenail procedure 05/15/2014, and was to get prescription for Tramadol.  Dr. Gabriel RungWagoner's orders called to pt [facility]

## 2014-05-17 NOTE — Telephone Encounter (Signed)
If she needs it then she can have tramadol 50mg  BID, disp #20

## 2014-05-30 ENCOUNTER — Telehealth: Payer: Self-pay | Admitting: *Deleted

## 2014-05-30 NOTE — Telephone Encounter (Signed)
Pt request fungal results.  I reviewed pt's records and the specimen was ordered 05/15/2014, and is not usually final for 4 - 6 weeks.  I informed pt.

## 2014-06-05 ENCOUNTER — Encounter: Payer: Self-pay | Admitting: Podiatry

## 2014-06-23 ENCOUNTER — Encounter: Payer: Self-pay | Admitting: Podiatry

## 2014-06-23 ENCOUNTER — Ambulatory Visit (INDEPENDENT_AMBULATORY_CARE_PROVIDER_SITE_OTHER): Payer: Managed Care, Other (non HMO) | Admitting: Podiatry

## 2014-06-23 VITALS — BP 127/80 | HR 91 | Resp 13

## 2014-06-23 DIAGNOSIS — Z79899 Other long term (current) drug therapy: Secondary | ICD-10-CM

## 2014-06-23 DIAGNOSIS — L600 Ingrowing nail: Secondary | ICD-10-CM

## 2014-06-23 LAB — HEPATIC FUNCTION PANEL
ALK PHOS: 70 U/L (ref 39–117)
ALT: 15 U/L (ref 0–35)
AST: 17 U/L (ref 0–37)
Albumin: 4.1 g/dL (ref 3.5–5.2)
BILIRUBIN DIRECT: 0.1 mg/dL (ref 0.0–0.3)
BILIRUBIN INDIRECT: 0.3 mg/dL (ref 0.2–1.2)
BILIRUBIN TOTAL: 0.4 mg/dL (ref 0.2–1.2)
Total Protein: 7.2 g/dL (ref 6.0–8.3)

## 2014-06-23 MED ORDER — TERBINAFINE HCL 250 MG PO TABS: 250.0000 mg | ORAL_TABLET | Freq: Every day | ORAL | Status: DC

## 2014-06-24 LAB — CBC WITH DIFFERENTIAL/PLATELET
BASOS ABS: 0.1 10*3/uL (ref 0.0–0.1)
BASOS PCT: 1 % (ref 0–1)
EOS ABS: 0.2 10*3/uL (ref 0.0–0.7)
Eosinophils Relative: 2 % (ref 0–5)
HEMATOCRIT: 38.2 % (ref 36.0–46.0)
Hemoglobin: 12.6 g/dL (ref 12.0–15.0)
LYMPHS PCT: 28 % (ref 12–46)
Lymphs Abs: 2.3 10*3/uL (ref 0.7–4.0)
MCH: 29.8 pg (ref 26.0–34.0)
MCHC: 33 g/dL (ref 30.0–36.0)
MCV: 90.3 fL (ref 78.0–100.0)
MPV: 10.3 fL (ref 8.6–12.4)
Monocytes Absolute: 0.7 10*3/uL (ref 0.1–1.0)
Monocytes Relative: 9 % (ref 3–12)
NEUTROS ABS: 4.9 10*3/uL (ref 1.7–7.7)
NEUTROS PCT: 60 % (ref 43–77)
Platelets: 352 10*3/uL (ref 150–400)
RBC: 4.23 MIL/uL (ref 3.87–5.11)
RDW: 13.6 % (ref 11.5–15.5)
WBC: 8.1 10*3/uL (ref 4.0–10.5)

## 2014-06-26 NOTE — Progress Notes (Signed)
Patient ID: Deanna Howe, female   DOB: 07/21/1970, 44 y.o.   MRN: [id number]  Subjective: 44 year old female returns the office or discussed nail biopsy results. Also she did not follow-up after nail procedure to the right side. She states that she is doing well. She states that she continue to soak it and keep it covered and about appointment and a Band-Aid until the site heel. She denies any pain associate the area or any redness, drainage/purulence. She doesn't of the left toenail started become symptomatic and she is inquiring about possible partial nail avulsion. She denies any drainage or redness on the side however it is painful particularly pressure. No other complaints at this time. She never to the Ultram as prescribed.  Objective: AAO 3, NAD DP/PT pulses palpable, CRT less than 3 seconds  Protective sensation intact with Simms Weinstein monofilament, vibratory sensation intact, Achilles tendon reflex intact. Right hallux nail status post partial nail avulsions which is healed. There is no tenderness to palpation along the procedure site there is no surrounding erythema, ascending cellulitis, drainage/purulence. Nails are hypertrophic, dystrophic, elongated, discolored. On the left medial hallux there is evidence of incurvation on the medial nail border there is mild tenderness to palpation. There is no swelling erythema or drainage. No other areas of tenderness to bilateral lower extremities. No overlying edema, erythema, increased warmth. MMT 5/5, ROM WNL No open lesions or pre-ulcer lesions identified bilaterally. No pain with calf compression, swelling, warmth, erythema.  Assessment: 44 year old female presents as discussed nail biopsy results;  left medial hallux ingrown toenail  Plan: -Treatment options were discussed the patient that he alternatives, risks, complications. -In regards to left hallux toenail discussed slant back procedure versus partial nail avulsion. She does not  want have the nail procedure done this point until she hasn't time office call. The nail sharply debrided without complications to patient comfort without any bleeding. Continue to monitor the area. -Nail biopsy results were discussed with the patient. 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 Various treatment options were discussed with the patient. This time she'll to proceed with Lamisil. Side effects the medication were discussed the patient for which she understands. At today's appointment she was given a prescription for Lamisil as well as blood work. I discussed with her not to start the Lamisil until she is call with results of the blood work. She verbally understood. -Follow-up in one month or starting Lamisil or sooner if any problems are to arise. In the meantime, encouraged to call the office with any questions, concerns, change in symptoms.

## 2014-06-29 ENCOUNTER — Telehealth: Payer: Self-pay | Admitting: *Deleted

## 2014-06-29 NOTE — Telephone Encounter (Signed)
Per Dr. Ardelle AntonWagoner, I called and informed patient that it's okay to start Lamisil.  Lab results are good.

## 2014-07-19 ENCOUNTER — Ambulatory Visit: Payer: Managed Care, Other (non HMO) | Admitting: Podiatry

## 2014-10-16 ENCOUNTER — Telehealth: Payer: Self-pay | Admitting: *Deleted

## 2014-10-16 NOTE — Telephone Encounter (Signed)
Pt states she was seen previously for an ingrown toenail, but would like to be seen for foot pain.

## 2014-10-19 ENCOUNTER — Ambulatory Visit: Payer: Managed Care, Other (non HMO) | Admitting: Podiatry

## 2015-03-07 ENCOUNTER — Encounter (HOSPITAL_COMMUNITY): Payer: Self-pay | Admitting: Emergency Medicine

## 2015-03-07 ENCOUNTER — Emergency Department (HOSPITAL_COMMUNITY)
Admission: EM | Admit: 2015-03-07 | Discharge: 2015-03-07 | Disposition: A | Discharge status: Home / Self Care | Payer: Managed Care, Other (non HMO) | Attending: Physician Assistant | Admitting: Physician Assistant

## 2015-03-07 DIAGNOSIS — F329 Major depressive disorder, single episode, unspecified: Secondary | ICD-10-CM | POA: Diagnosis not present

## 2015-03-07 DIAGNOSIS — Z91040 Latex allergy status: Secondary | ICD-10-CM | POA: Insufficient documentation

## 2015-03-07 DIAGNOSIS — R112 Nausea with vomiting, unspecified: Secondary | ICD-10-CM

## 2015-03-07 DIAGNOSIS — Z880 Allergy status to penicillin: Secondary | ICD-10-CM | POA: Insufficient documentation

## 2015-03-07 DIAGNOSIS — R197 Diarrhea, unspecified: Secondary | ICD-10-CM | POA: Diagnosis not present

## 2015-03-07 DIAGNOSIS — F419 Anxiety disorder, unspecified: Secondary | ICD-10-CM | POA: Insufficient documentation

## 2015-03-07 DIAGNOSIS — Z79899 Other long term (current) drug therapy: Secondary | ICD-10-CM | POA: Insufficient documentation

## 2015-03-07 DIAGNOSIS — I10 Essential (primary) hypertension: Secondary | ICD-10-CM | POA: Diagnosis not present

## 2015-03-07 DIAGNOSIS — Z8711 Personal history of peptic ulcer disease: Secondary | ICD-10-CM | POA: Insufficient documentation

## 2015-03-07 DIAGNOSIS — R109 Unspecified abdominal pain: Secondary | ICD-10-CM | POA: Diagnosis present

## 2015-03-07 DIAGNOSIS — R1012 Left upper quadrant pain: Secondary | ICD-10-CM | POA: Insufficient documentation

## 2015-03-07 LAB — COMPREHENSIVE METABOLIC PANEL
ALK PHOS: 69 U/L (ref 38–126)
ALT: 35 U/L (ref 14–54)
AST: 34 U/L (ref 15–41)
Albumin: 3.5 g/dL (ref 3.5–5.0)
Anion gap: 9 (ref 5–15)
BILIRUBIN TOTAL: 0.7 mg/dL (ref 0.3–1.2)
BUN: 9 mg/dL (ref 6–20)
CALCIUM: 8.7 mg/dL — AB (ref 8.9–10.3)
CO2: 25 mmol/L (ref 22–32)
CREATININE: 0.82 mg/dL (ref 0.44–1.00)
Chloride: 103 mmol/L (ref 101–111)
GFR calc Af Amer: >60 mL/min (ref >60)
Glucose, Bld: 128 mg/dL — ABNORMAL HIGH (ref 65–99)
Potassium: 3.6 mmol/L (ref 3.5–5.1)
Sodium: 137 mmol/L (ref 135–145)
TOTAL PROTEIN: 6.6 g/dL (ref 6.5–8.1)

## 2015-03-07 LAB — CBC WITH DIFFERENTIAL/PLATELET
BASOS ABS: 0 10*3/uL (ref 0.0–0.1)
Basophils Relative: 0 %
Eosinophils Absolute: 0.1 10*3/uL (ref 0.0–0.7)
Eosinophils Relative: 1 %
HEMATOCRIT: 36.9 % (ref 36.0–46.0)
Hemoglobin: 12.2 g/dL (ref 12.0–15.0)
LYMPHS PCT: 10 %
Lymphs Abs: 0.7 10*3/uL (ref 0.7–4.0)
MCH: 29.6 pg (ref 26.0–34.0)
MCHC: 33.1 g/dL (ref 30.0–36.0)
MCV: 89.6 fL (ref 78.0–100.0)
MONO ABS: 0.4 10*3/uL (ref 0.1–1.0)
Monocytes Relative: 5 %
NEUTROS ABS: 5.6 10*3/uL (ref 1.7–7.7)
Neutrophils Relative %: 84 %
Platelets: 274 10*3/uL (ref 150–400)
RBC: 4.12 MIL/uL (ref 3.87–5.11)
RDW: 13.4 % (ref 11.5–15.5)
WBC: 6.7 10*3/uL (ref 4.0–10.5)

## 2015-03-07 LAB — LIPASE, BLOOD: LIPASE: 20 U/L (ref 11–51)

## 2015-03-07 LAB — URINALYSIS, ROUTINE W REFLEX MICROSCOPIC
BILIRUBIN URINE: NEGATIVE
GLUCOSE, UA: NEGATIVE mg/dL
Hgb urine dipstick: NEGATIVE
KETONES UR: NEGATIVE mg/dL
Leukocytes, UA: NEGATIVE
Nitrite: NEGATIVE
PH: 7.5 (ref 5.0–8.0)
Protein, ur: NEGATIVE mg/dL
Specific Gravity, Urine: 1.023 (ref 1.005–1.030)
Urobilinogen, UA: 1 mg/dL (ref 0.0–1.0)

## 2015-03-07 MED ORDER — METOCLOPRAMIDE HCL 5 MG/ML IJ SOLN
10.0000 mg | INTRAMUSCULAR | Status: AC
  Administered 2015-03-07: 10 mg via INTRAVENOUS
  Filled 2015-03-07: qty 2

## 2015-03-07 MED ORDER — SODIUM CHLORIDE 0.9 % IV BOLUS (SEPSIS)
1000.0000 mL | Freq: Once | INTRAVENOUS | Status: AC
  Administered 2015-03-07: 1000 mL via INTRAVENOUS

## 2015-03-07 MED ORDER — ONDANSETRON 4 MG PO TBDP: 4.0000 mg | ORAL_TABLET | Freq: Three times a day (TID) | ORAL | Status: DC | PRN

## 2015-03-07 MED ORDER — DICYCLOMINE HCL 10 MG PO CAPS
20.0000 mg | ORAL_CAPSULE | Freq: Once | ORAL | Status: AC
  Administered 2015-03-07: 20 mg via ORAL
  Filled 2015-03-07: qty 2

## 2015-03-07 MED ORDER — KETOROLAC TROMETHAMINE 30 MG/ML IJ SOLN
30.0000 mg | Freq: Once | INTRAMUSCULAR | Status: AC
  Administered 2015-03-07: 30 mg via INTRAVENOUS
  Filled 2015-03-07: qty 1

## 2015-03-07 MED ORDER — DICYCLOMINE HCL 20 MG PO TABS: 20.0000 mg | ORAL_TABLET | Freq: Two times a day (BID) | ORAL | Status: DC

## 2015-03-07 MED ORDER — ONDANSETRON HCL 4 MG/2ML IJ SOLN
4.0000 mg | Freq: Once | INTRAMUSCULAR | Status: AC
  Administered 2015-03-07: 4 mg via INTRAVENOUS
  Filled 2015-03-07: qty 2

## 2015-03-07 MED ORDER — MORPHINE SULFATE (PF) 4 MG/ML IV SOLN
4.0000 mg | Freq: Once | INTRAVENOUS | Status: DC
  Filled 2015-03-07: qty 1

## 2015-03-07 NOTE — ED Provider Notes (Signed)
CSN: [id number]     Arrival date & time 03/07/15  4098 History   First MD Initiated Contact with Patient 03/07/15 [phone]     Chief Complaint  Patient presents with  . Abdominal Pain  . Emesis  . Diarrhea     (Consider location/radiation/quality/duration/timing/severity/associated sxs/prior Treatment) Patient is a 44 y.o. female presenting with abdominal pain, vomiting, and diarrhea. The history is provided by the patient and medical records.  Abdominal Pain Associated symptoms: diarrhea, nausea and vomiting   Emesis Associated symptoms: abdominal pain and diarrhea   Diarrhea Associated symptoms: abdominal pain and vomiting     44 year old female with history of hypertension, anxiety, depression, peptic ulcer disease, presenting to the ED for abdominal pain, nausea, vomiting, and diarrhea. Patient states she recently went on a cruise to the [place] for her birthday. She states she returned on Sunday and has had ongoing symptoms since this time. She denies any known sick contacts while on vacation. States she did eat a variety of foods on the cruise ship, but states this is not generally anything out of ordinary for her. She denies any fever or chills.  States she has been having approx 4 loose stools daily and approx 2-3 episodes of emesis daily.  All emesis and diarrhea has been non-bloody, non-bilious.  No fever, chills, sweats.  VSS.  Past Medical History  Diagnosis Date  . Hypertension   . Anxiety   . Depression   . Peptic ulcer due to Helicobacter pylori    Past Surgical History  Procedure Laterality Date  . Cesarean section    . Cholecystectomy    . Appendectomy    . Oophorectomy      RT side  . Laparoscopy N/A 09/06/2012    Procedure: LAPAROSCOPY OPERATIVE;  Surgeon: Zelphia Cairo, MD;  Location: [facility];  Service: Gynecology;  Laterality: N/A;  . Incision and drainage Left 09/06/2012    Procedure: INCISION AND DRAINAGE;  Surgeon: Zelphia Cairo, MD;  Location: [facility];   Service: Gynecology;  Laterality: Left;  of ovarian cyst   No family history on file. Social History  Substance Use Topics  . Smoking status: Never Smoker   . Smokeless tobacco: None  . Alcohol Use: No   OB History    No data available     Review of Systems  Gastrointestinal: Positive for nausea, vomiting, abdominal pain and diarrhea.  All other systems reviewed and are negative.     Allergies  Codeine; Dilaudid; Hydrocodone; Latex; Penicillins; and Adhesive  Home Medications   Prior to Admission medications   Medication Sig Start Date End Date Taking? Authorizing Provider  buPROPion (WELLBUTRIN XL) 150 MG 24 hr tablet Take 150 mg by mouth daily.    Historical Provider, MD  citalopram (CELEXA) 10 MG tablet Take 30 mg by mouth daily.    Historical Provider, MD  hydrochlorothiazide (HYDRODIURIL) 25 MG tablet Take 25 mg by mouth daily.    Historical Provider, MD  Multiple Vitamins-Minerals (MULTIVITAMIN WITH MINERALS) tablet Take 1 tablet by mouth daily.    Historical Provider, MD  pantoprazole (PROTONIX) 20 MG tablet Take 20 mg by mouth daily.    Historical Provider, MD  terbinafine (LAMISIL) 250 MG tablet Take 1 tablet (250 mg total) by mouth daily. 06/23/14   Vivi Barrack, DPM  traMADol (ULTRAM) 50 MG tablet Take 1 tablet (50 mg total) by mouth 2 (two) times daily. 05/17/14   Vivi Barrack, DPM   BP 110/86 mmHg  Pulse 99  Temp(Src) 98 F (36.7 C) (Oral)  Resp 16  SpO2 98%   Physical Exam  Constitutional: She is oriented to person, place, and time. She appears well-developed and well-nourished. No distress.  HENT:  Head: Normocephalic and atraumatic.  Mouth/Throat: Oropharynx is clear and moist.  Eyes: Conjunctivae and EOM are normal. Pupils are equal, round, and reactive to light.  Neck: Normal range of motion. Neck supple.  Cardiovascular: Normal rate, regular rhythm and normal heart sounds.   Pulmonary/Chest: Effort normal and breath sounds normal. No  respiratory distress. She has no wheezes.  Abdominal: Soft. Bowel sounds are normal. There is tenderness in the left upper quadrant. There is no guarding.    Musculoskeletal: Normal range of motion. She exhibits no edema.  Neurological: She is alert and oriented to person, place, and time.  Skin: Skin is warm and dry. She is not diaphoretic.  Psychiatric: She has a normal mood and affect.  Nursing note and vitals reviewed.   ED Course  Procedures (including critical care time) Labs Review Labs Reviewed  COMPREHENSIVE METABOLIC PANEL - Abnormal; Notable for the following:    Glucose, Bld 128 (*)    Calcium 8.7 (*)    All other components within normal limits  CBC WITH DIFFERENTIAL/PLATELET  LIPASE, BLOOD  URINALYSIS, ROUTINE W REFLEX MICROSCOPIC (NOT AT [facility])    Imaging Review No results found. I have personally reviewed and evaluated these images and lab results as part of my medical decision-making.   EKG Interpretation   Date/Time:  Wednesday March 07 2015 08:14:27 EST Ventricular Rate:  83 PR Interval:  192 QRS Duration: 82 QT Interval:  356 QTC Calculation: 418 R Axis:   45 Text Interpretation:  Sinus rhythm Low voltage, precordial leads no acute  ischemia No significant change since last tracing Confirmed by Kandis MannanMACKUEN,  COURTNEY ([id number]) on 03/07/2015 8:21:39 AM      MDM   Final diagnoses:  Abdominal pain, unspecified abdominal location  Nausea vomiting and diarrhea   44 year old female here with left upper quadrant abdominal pain, nausea, vomiting, and diarrhea after returning from a cruise to the [place]. Patient is afebrile, nontoxic. She has tenderness in her left upper quadrant without rebound or guarding. No peritonitis. Labwork was obtained which is reassuring, no leukocytosis or electrolyte imbalance. LFTs are within normal limits and I do not suspect acute hepatitis. Illness is likely viral in nature. Patient was treated with IV fluids, Zofran, Reglan,  and Bentyl with improvement of her symptoms. She has tolerated oral fluids here in the emergency department without recurrent vomiting. She's not had any episodes of diarrhea. Vital signs remained stable. Patient is stable for discharge. Will discharge home with continued supportive care including Zofran and Bentyl. Patient was encouraged to stay hydrated with oral fluids, rest, start with gentle diet and progress back to normal as tolerated.  Patient will follow-up with her PCP.  Discussed plan with patient, he/she acknowledged understanding and agreed with plan of care.  Return precautions given for new or worsening symptoms.  Garlon HatchetLisa M Haruko Mersch, PA-C 03/07/15 1421  Courteney Randall AnLyn Mackuen, MD 03/07/15 1525

## 2015-03-07 NOTE — Discharge Instructions (Signed)
Take the prescribed medication as directed.  Make sure to rest and drink plenty of fluids.  Recommend gentle diet and progress back to normal as tolerated. Follow-up with your primary care physician. Return to the ED for new or worsening symptoms.

## 2015-03-07 NOTE — ED Notes (Signed)
Pt continues to c/o left chest pain, under left breast radiating to back. States stops for a little while then returns. Unable to take morphine or any opiate pain  Med- causes hives.

## 2015-03-07 NOTE — ED Notes (Signed)
Pt from home for eval of n/v/d since Sunday, pt states recently went to [place] for vacation and felt somewhat nauseous while on the trip. Pt states this morning she woke up with emesis and diarrhea, denies any hemoptysis or blood in stool. Denies any fever. nad noted.

## 2015-05-28 LAB — HIV ANTIBODY (ROUTINE TESTING W REFLEX): HIV 1&2 Ab, 4th Generation: NEGATIVE

## 2015-06-18 ENCOUNTER — Emergency Department (HOSPITAL_COMMUNITY)
Admission: EM | Admit: 2015-06-18 | Discharge: 2015-06-18 | Disposition: A | Discharge status: Home / Self Care | Payer: Managed Care, Other (non HMO) | Attending: Emergency Medicine | Admitting: Emergency Medicine

## 2015-06-18 ENCOUNTER — Emergency Department (HOSPITAL_COMMUNITY): Payer: Managed Care, Other (non HMO)

## 2015-06-18 ENCOUNTER — Emergency Department (HOSPITAL_BASED_OUTPATIENT_CLINIC_OR_DEPARTMENT_OTHER)
Admit: 2015-06-18 | Discharge: 2015-06-18 | Disposition: A | Discharge status: Home / Self Care | Payer: Managed Care, Other (non HMO) | Attending: Emergency Medicine | Admitting: Emergency Medicine

## 2015-06-18 ENCOUNTER — Encounter (HOSPITAL_COMMUNITY): Payer: Self-pay

## 2015-06-18 DIAGNOSIS — Z8619 Personal history of other infectious and parasitic diseases: Secondary | ICD-10-CM | POA: Diagnosis not present

## 2015-06-18 DIAGNOSIS — R079 Chest pain, unspecified: Secondary | ICD-10-CM

## 2015-06-18 DIAGNOSIS — Z880 Allergy status to penicillin: Secondary | ICD-10-CM | POA: Insufficient documentation

## 2015-06-18 DIAGNOSIS — F419 Anxiety disorder, unspecified: Secondary | ICD-10-CM | POA: Insufficient documentation

## 2015-06-18 DIAGNOSIS — M79609 Pain in unspecified limb: Secondary | ICD-10-CM | POA: Diagnosis not present

## 2015-06-18 DIAGNOSIS — M25561 Pain in right knee: Secondary | ICD-10-CM | POA: Insufficient documentation

## 2015-06-18 DIAGNOSIS — Z79899 Other long term (current) drug therapy: Secondary | ICD-10-CM | POA: Diagnosis not present

## 2015-06-18 DIAGNOSIS — F329 Major depressive disorder, single episode, unspecified: Secondary | ICD-10-CM | POA: Diagnosis not present

## 2015-06-18 DIAGNOSIS — Z91040 Latex allergy status: Secondary | ICD-10-CM | POA: Diagnosis not present

## 2015-06-18 DIAGNOSIS — Z8711 Personal history of peptic ulcer disease: Secondary | ICD-10-CM | POA: Insufficient documentation

## 2015-06-18 DIAGNOSIS — I10 Essential (primary) hypertension: Secondary | ICD-10-CM | POA: Diagnosis not present

## 2015-06-18 LAB — CBC
HCT: 37.9 % (ref 36.0–46.0)
Hemoglobin: 12.3 g/dL (ref 12.0–15.0)
MCH: 28.9 pg (ref 26.0–34.0)
MCHC: 32.5 g/dL (ref 30.0–36.0)
MCV: 89.2 fL (ref 78.0–100.0)
PLATELETS: 319 10*3/uL (ref 150–400)
RBC: 4.25 MIL/uL (ref 3.87–5.11)
RDW: 13.4 % (ref 11.5–15.5)
WBC: 6.1 10*3/uL (ref 4.0–10.5)

## 2015-06-18 LAB — BASIC METABOLIC PANEL
Anion gap: 13 (ref 5–15)
BUN: 7 mg/dL (ref 6–20)
CALCIUM: 9.7 mg/dL (ref 8.9–10.3)
CO2: 25 mmol/L (ref 22–32)
CREATININE: 0.78 mg/dL (ref 0.44–1.00)
Chloride: 104 mmol/L (ref 101–111)
Glucose, Bld: 104 mg/dL — ABNORMAL HIGH (ref 65–99)
Potassium: 3.6 mmol/L (ref 3.5–5.1)
SODIUM: 142 mmol/L (ref 135–145)

## 2015-06-18 LAB — I-STAT TROPONIN, ED
TROPONIN I, POC: 0 ng/mL (ref 0.00–0.08)
Troponin i, poc: 0.07 ng/mL (ref 0.00–0.08)
Troponin i, poc: 0 ng/mL (ref 0.00–0.08)

## 2015-06-18 NOTE — Progress Notes (Signed)
VASCULAR LAB PRELIMINARY  PRELIMINARY  PRELIMINARY  PRELIMINARY  Right lower extremity venous duplex has been completed.     Right:  No evidence of DVT, superficial thrombosis, or  Baker's cyst.  Jenetta Loges, RVT, RDMS 06/18/2015, 12:59 PM

## 2015-06-18 NOTE — ED Provider Notes (Addendum)
CSN: [id number]     Arrival date & time 06/18/15  4098 History   First MD Initiated Contact with Patient 06/18/15 1042     Chief Complaint  Patient presents with  . Knee Pain  . Chest Pain     (Consider location/radiation/quality/duration/timing/severity/associated sxs/prior Treatment) HPI  44 who presents today complaining of right knee pain for 2 days. She describes it as in the back of the knee. It is sharp in nature. It is worse when she is on her feet. She feels her leg sometimes buckles. She does not have increased pain with movement. She has not had similar symptoms in the past. She denies any history of DVT or swelling distal to the pain.. She denies any recent injury. She states that she is also having some intermittent left-sided sharp pain in her chest. This began yesterday at work and has occurred some intermittently. There are no worsening factors. She is not dyspneic. There is some increased pain with movement. She denies any recent trauma to her chest. The episodes occur up to 5 minutes in length. She is not sure what brings them on. She had an episode during the night. She denies any fever, cough, nausea, vomiting or dyspnea.  Past Medical History  Diagnosis Date  . Hypertension   . Anxiety   . Depression   . Peptic ulcer due to Helicobacter pylori    Past Surgical History  Procedure Laterality Date  . Cesarean section    . Cholecystectomy    . Appendectomy    . Oophorectomy      RT side  . Laparoscopy N/A 09/06/2012    Procedure: LAPAROSCOPY OPERATIVE;  Surgeon: Zelphia Cairo, MD;  Location: [facility];  Service: Gynecology;  Laterality: N/A;  . Incision and drainage Left 09/06/2012    Procedure: INCISION AND DRAINAGE;  Surgeon: Zelphia Cairo, MD;  Location: [facility];  Service: Gynecology;  Laterality: Left;  of ovarian cyst   No family history on file. Social History  Substance Use Topics  . Smoking status: Never Smoker   . Smokeless tobacco: None  . Alcohol Use:  No   OB History    No data available     Review of Systems  All other systems reviewed and are negative.     Allergies  Codeine; Dilaudid; Hydrocodone; Latex; Penicillins; Adhesive; and Morphine and related  Home Medications   Prior to Admission medications   Medication Sig Start Date End Date Taking? Authorizing Provider  bimatoprost (LUMIGAN) 0.03 % ophthalmic solution Place 1 drop into both eyes at bedtime.    Historical Provider, MD  buPROPion (WELLBUTRIN XL) 150 MG 24 hr tablet Take 150 mg by mouth daily.    Historical Provider, MD  citalopram (CELEXA) 10 MG tablet Take 30 mg by mouth daily.    Historical Provider, MD  dicyclomine (BENTYL) 20 MG tablet Take 1 tablet (20 mg total) by mouth 2 (two) times daily. 03/07/15   Garlon Hatchet, PA-C  EPINEPHrine 0.3 mg/0.3 mL IJ SOAJ injection Inject 0.3 mg into the muscle as needed. 02/21/13   Historical Provider, MD  hydrochlorothiazide (HYDRODIURIL) 25 MG tablet Take 25 mg by mouth daily.    Historical Provider, MD  Multiple Vitamins-Minerals (MULTIVITAMIN WITH MINERALS) tablet Take 1 tablet by mouth daily.    Historical Provider, MD  ondansetron (ZOFRAN ODT) 4 MG disintegrating tablet Take 1 tablet (4 mg total) by mouth every 8 (eight) hours as needed for nausea. 03/07/15   Garlon Hatchet, PA-C  traMADol (ULTRAM) 50 MG tablet Take 1 tablet (50 mg total) by mouth 2 (two) times daily. Patient not taking: Reported on 03/07/2015 05/17/14   Vivi Barrack, DPM   BP 120/72 mmHg  Pulse 61  Temp(Src) 98.8 F (37.1 C) (Oral)  Resp 16  Ht 5\' 9"  (1.753 m)  Wt 129.275 kg  BMI 42.07 kg/m2  SpO2 100% Physical Exam  Constitutional: She is oriented to person, place, and time. She appears well-developed and well-nourished.  HENT:  Head: Normocephalic and atraumatic.  Right Ear: External ear normal.  Left Ear: External ear normal.  Nose: Nose normal.  Mouth/Throat: Oropharynx is clear and moist.  Eyes: Conjunctivae and EOM are normal.  Pupils are equal, round, and reactive to light.  Neck: Normal range of motion. Neck supple.  Cardiovascular: Normal rate, regular rhythm, normal heart sounds and intact distal pulses.   Pulmonary/Chest: Effort normal and breath sounds normal.  Abdominal: Soft. Bowel sounds are normal.  Musculoskeletal: Normal range of motion.       Legs: Neurological: She is alert and oriented to person, place, and time. She has normal reflexes.  Skin: Skin is warm and dry.  Psychiatric: She has a normal mood and affect. Her behavior is normal. Judgment and thought content normal.  Nursing note and vitals reviewed.   ED Course  Procedures (including critical care time) Labs Review Labs Reviewed  BASIC METABOLIC PANEL - Abnormal; Notable for the following:    Glucose, Bld 104 (*)    All other components within normal limits  CBC  I-STAT TROPOININ, ED  Rosezena Sensor, ED    Imaging Review Dg Chest 2 View  06/18/2015  CLINICAL DATA:  Chest pain with radiation into the left arm EXAM: CHEST  2 VIEW COMPARISON:  None. FINDINGS: The heart size and mediastinal contours are within normal limits. Both lungs are clear. The visualized skeletal structures are unremarkable. IMPRESSION: No active cardiopulmonary disease. Electronically Signed   By: Alcide Clever M.D.   On: 06/18/2015 12:09   Dg Knee Complete 4 Views Right  06/18/2015  CLINICAL DATA:  Posterior right knee pain for 3 weeks. Worse with weight-bearing. No known injury. EXAM: RIGHT KNEE - COMPLETE 4+ VIEW COMPARISON:  None. FINDINGS: There is no evidence of fracture, dislocation, or joint effusion. Mild degenerative spurring is seen involving the medial compartment and patella. No evidence of joint space narrowing. No other significant bone abnormality identified. IMPRESSION: No acute findings. Mild degenerative spurring of medial compartment and patella. Electronically Signed   By: Myles Rosenthal M.D.   On: 06/18/2015 10:12   I have personally reviewed  and evaluated these images and lab results as part of my medical decision-making.   EKG Interpretation   Date/Time:  Monday June 18 2015 09:48:03 EST Ventricular Rate:  79 PR Interval:  194 QRS Duration: 82 QT Interval:  392 QTC Calculation: 449 R Axis:   47 Text Interpretation:  Normal sinus rhythm Normal ECG Confirmed by Znya Albino MD,  Ritha Sampedro ([id number]) on 06/18/2015 10:53:38 AM      MDM     1 atypical chest pain patient with heart score of 3 with low index suspicion for cardiac chest pain. Patient with pain in her leg, but it is negative for DVT. Chest x-Manon [place] is clear and I have a low index of suspicion of great vessel pathology as the cause of her pain. Patient is advised regarding return precautions and need for follow-up of voices understanding.First troponin .00, second.07- plan third troponin at  1600 2 knee pain Doppler of right leg is negative for dvt.    Margarita Grizzle, MD 06/18/15 1333  Margarita Grizzle, MD 06/18/15 1610  Margarita Grizzle, MD 06/18/15 [id number]

## 2015-06-18 NOTE — ED Notes (Signed)
Pt left for XRay 

## 2015-06-18 NOTE — Discharge Instructions (Signed)
Knee Pain Knee pain is a common problem. It can have many causes. The pain often goes away by following your doctor's home care instructions. Treatment for ongoing pain will depend on the cause of your pain. If your knee pain continues, more tests may be needed to diagnose your condition. Tests may include X-rays or other imaging studies of your knee. HOME CARE  Take medicines only as told by your doctor.  Rest your knee and keep it raised (elevated) while you are resting.  Do not do things that cause pain or make your pain worse.  Avoid activities where both feet leave the ground at the same time, such as running, jumping rope, or doing jumping jacks.  Apply ice to the knee area:  Put ice in a plastic bag.  Place a towel between your skin and the bag.  Leave the ice on for 20 minutes, 2-3 times a day.  Ask your doctor if you should wear an elastic knee support.  Sleep with a pillow under your knee.  Lose weight if you are overweight. Being overweight can make your knee hurt more.  Do not use any tobacco products, including cigarettes, chewing tobacco, or electronic cigarettes. If you need help quitting, ask your doctor. Smoking may slow the healing of any bone and joint problems that you may have. GET HELP IF:  Your knee pain does not stop, it changes, or it gets worse.  You have a fever along with knee pain.  Your knee gives out or locks up.  Your knee becomes more swollen. GET HELP RIGHT AWAY IF:   Your knee feels hot to the touch.  You have chest pain or trouble breathing.   This information is not intended to replace advice given to you by your health care provider. Make sure you discuss any questions you have with your health care provider.   Document Released: 07/11/2008 Document Revised: 05/05/2014 Document Reviewed: 06/15/2013 Elsevier Interactive Patient Education 2016 [employer] Nonspecific Chest Pain It is often hard to find the cause of chest pain.  There is always a chance that your pain could be related to something serious, such as a heart attack or a blood clot in your lungs. Chest pain can also be caused by conditions that are not life-threatening. If you have chest pain, it is very important to follow up with your doctor.  HOME CARE  If you were prescribed an antibiotic medicine, finish it all even if you start to feel better.  Avoid any activities that cause chest pain.  Do not use any tobacco products, including cigarettes, chewing tobacco, or electronic cigarettes. If you need help quitting, ask your doctor.  Do not drink alcohol.  Take medicines only as told by your doctor.  Keep all follow-up visits as told by your doctor. This is important. This includes any further testing if your chest pain does not go away.  Your doctor may tell you to keep your head raised (elevated) while you sleep.  Make lifestyle changes as told by your doctor. These may include:  Getting regular exercise. Ask your doctor to suggest some activities that are safe for you.  Eating a heart-healthy diet. Your doctor or a diet specialist (dietitian) can help you to learn healthy eating options.  Maintaining a healthy weight.  Managing diabetes, if necessary.  Reducing stress. GET HELP IF:  Your chest pain does not go away, even after treatment.  You have a rash with blisters on your chest.  You  have a fever. GET HELP RIGHT AWAY IF:  Your chest pain is worse.  You have an increasing cough, or you cough up blood.  You have severe belly (abdominal) pain.  You feel extremely weak.  You pass out (faint).  You have chills.  You have sudden, unexplained chest discomfort.  You have sudden, unexplained discomfort in your arms, back, neck, or jaw.  You have shortness of breath at any time.  You suddenly start to sweat, or your skin gets clammy.  You feel nauseous.  You vomit.  You suddenly feel light-headed or dizzy.  Your  heart begins to beat quickly, or it feels like it is skipping beats. These symptoms may be an emergency. Do not wait to see if the symptoms will go away. Get medical help right away. Call your local emergency services (911 in the [place] Do not drive yourself to the hospital.   This information is not intended to replace advice given to you by your health care provider. Make sure you discuss any questions you have with your health care provider.   Document Released: 10/01/2007 Document Revised: 05/05/2014 Document Reviewed: 11/18/2013 Elsevier Interactive Patient Education [employer]

## 2015-06-18 NOTE — ED Notes (Signed)
Patient here with left sided chest pain intermittently for several days, also having sharp right knee pain x 2 days. No pain on arrival

## 2016-10-31 ENCOUNTER — Encounter: Payer: Self-pay | Admitting: Emergency Medicine

## 2016-10-31 ENCOUNTER — Emergency Department: Admission: EM | Admit: 2016-10-31 | Discharge: 2016-10-31 | Discharge status: Absent without leave | Payer: Self-pay

## 2016-10-31 ENCOUNTER — Emergency Department
Admission: EM | Admit: 2016-10-31 | Discharge: 2016-11-01 | Disposition: A | Discharge status: Home / Self Care | Payer: Managed Care, Other (non HMO) | Attending: Emergency Medicine | Admitting: Emergency Medicine

## 2016-10-31 DIAGNOSIS — T50992A Poisoning by other drugs, medicaments and biological substances, intentional self-harm, initial encounter: Secondary | ICD-10-CM | POA: Insufficient documentation

## 2016-10-31 DIAGNOSIS — I10 Essential (primary) hypertension: Secondary | ICD-10-CM | POA: Insufficient documentation

## 2016-10-31 DIAGNOSIS — F331 Major depressive disorder, recurrent, moderate: Secondary | ICD-10-CM | POA: Insufficient documentation

## 2016-10-31 DIAGNOSIS — T50902A Poisoning by unspecified drugs, medicaments and biological substances, intentional self-harm, initial encounter: Secondary | ICD-10-CM

## 2016-10-31 LAB — TROPONIN I: Troponin I: <0.03 ng/mL (ref <0.03)

## 2016-10-31 LAB — URINALYSIS, COMPLETE (UACMP) WITH MICROSCOPIC
BACTERIA UA: NONE SEEN
Bilirubin Urine: NEGATIVE
Glucose, UA: NEGATIVE mg/dL
Hgb urine dipstick: NEGATIVE
KETONES UR: NEGATIVE mg/dL
Leukocytes, UA: NEGATIVE
Nitrite: NEGATIVE
PROTEIN: NEGATIVE mg/dL
Specific Gravity, Urine: 1.02 (ref 1.005–1.030)
pH: 6 (ref 5.0–8.0)

## 2016-10-31 LAB — COMPREHENSIVE METABOLIC PANEL
ALBUMIN: 3.6 g/dL (ref 3.5–5.0)
ALK PHOS: 66 U/L (ref 38–126)
ALT: 37 U/L (ref 14–54)
AST: 42 U/L — AB (ref 15–41)
Anion gap: 7 (ref 5–15)
BUN: 9 mg/dL (ref 6–20)
CO2: 29 mmol/L (ref 22–32)
Calcium: 8.5 mg/dL — ABNORMAL LOW (ref 8.9–10.3)
Chloride: 104 mmol/L (ref 101–111)
Creatinine, Ser: 0.81 mg/dL (ref 0.44–1.00)
GFR calc Af Amer: >60 mL/min (ref >60)
GFR calc non Af Amer: >60 mL/min (ref >60)
GLUCOSE: 101 mg/dL — AB (ref 65–99)
POTASSIUM: 3.4 mmol/L — AB (ref 3.5–5.1)
Sodium: 140 mmol/L (ref 135–145)
Total Bilirubin: 0.7 mg/dL (ref 0.3–1.2)
Total Protein: 6.8 g/dL (ref 6.5–8.1)

## 2016-10-31 LAB — TSH: TSH: 2.251 u[IU]/mL (ref 0.350–4.500)

## 2016-10-31 LAB — ACETAMINOPHEN LEVEL

## 2016-10-31 LAB — CBC WITH DIFFERENTIAL/PLATELET
BASOS ABS: 0.1 10*3/uL (ref 0–0.1)
BASOS PCT: 1 %
Eosinophils Absolute: 0.2 10*3/uL (ref 0–0.7)
Eosinophils Relative: 2 %
HCT: 35.6 % (ref 35.0–47.0)
HEMOGLOBIN: 12.1 g/dL (ref 12.0–16.0)
Lymphocytes Relative: 33 %
Lymphs Abs: 3.6 10*3/uL (ref 1.0–3.6)
MCH: 30.4 pg (ref 26.0–34.0)
MCHC: 33.9 g/dL (ref 32.0–36.0)
MCV: 89.7 fL (ref 80.0–100.0)
Monocytes Absolute: 0.8 10*3/uL (ref 0.2–0.9)
Monocytes Relative: 7 %
NEUTROS PCT: 57 %
Neutro Abs: 6.1 10*3/uL (ref 1.4–6.5)
Platelets: 315 10*3/uL (ref 150–440)
RBC: 3.97 MIL/uL (ref 3.80–5.20)
RDW: 13.7 % (ref 11.5–14.5)
WBC: 10.7 10*3/uL (ref 3.6–11.0)

## 2016-10-31 LAB — URINE DRUG SCREEN, QUALITATIVE (ARMC ONLY)
AMPHETAMINES, UR SCREEN: NOT DETECTED
BENZODIAZEPINE, UR SCRN: NOT DETECTED
Barbiturates, Ur Screen: NOT DETECTED
Cannabinoid 50 Ng, Ur ~~LOC~~: NOT DETECTED
Cocaine Metabolite,Ur ~~LOC~~: NOT DETECTED
MDMA (Ecstasy)Ur Screen: NOT DETECTED
Methadone Scn, Ur: NOT DETECTED
OPIATE, UR SCREEN: NOT DETECTED
PHENCYCLIDINE (PCP) UR S: NOT DETECTED
Tricyclic, Ur Screen: NOT DETECTED

## 2016-10-31 LAB — ETHANOL

## 2016-10-31 LAB — SALICYLATE LEVEL: Salicylate Lvl: <7 mg/dL (ref 2.8–30.0)

## 2016-10-31 NOTE — ED Provider Notes (Signed)
[facility] Emergency Department Provider Note       Time seen: ----------------------------------------- 10:12 PM on 10/31/2016 -----------------------------------------     I have reviewed the triage vital signs and the nursing notes.   HISTORY   Chief Complaint Suicidal    HPI Deanna Howe is a 46 y.o. female who presents to the ED after she took 7 Benadryl and 7 ibuprofen over-the-counter approximately 1-2 hours ago. Police were alerted by a family member that states they were worried about the patient because of the conversation and had earlier. Patient states she is going through a difficult time with her husband and was not trying to kill herself, she states she was just trying to go to sleep. She denies any recent illness or other complaints. Patient states she feels a little drowsy but otherwise denies complaints.   Past Medical History:  Diagnosis Date  . Anxiety   . Depression   . Hypertension   . Peptic ulcer due to Helicobacter pylori     There are no active problems to display for this patient.   Past Surgical History:  Procedure Laterality Date  . APPENDECTOMY    . CESAREAN SECTION    . CHOLECYSTECTOMY    . INCISION AND DRAINAGE Left 09/06/2012   Procedure: INCISION AND DRAINAGE;  Surgeon: Zelphia Cairo, MD;  Location: [facility];  Service: Gynecology;  Laterality: Left;  of ovarian cyst  . LAPAROSCOPY N/A 09/06/2012   Procedure: LAPAROSCOPY OPERATIVE;  Surgeon: Zelphia Cairo, MD;  Location: [facility];  Service: Gynecology;  Laterality: N/A;  . OOPHORECTOMY     RT side    Allergies Codeine; Dilaudid [hydromorphone hcl]; Hydrocodone; Latex; Penicillins; Adhesive [tape]; and Morphine and related  Social History Social History  Substance Use Topics  . Smoking status: Never Smoker  . Smokeless tobacco: Never Used  . Alcohol use No    Review of Systems Constitutional: Negative for fever. Cardiovascular: Negative for chest  pain. Respiratory: Negative for shortness of breath. Gastrointestinal: Negative for abdominal pain, vomiting and diarrhea. Genitourinary: Negative for dysuria. Musculoskeletal: Negative for back pain. Skin: Negative for rash. Neurological: Negative for headaches, focal weakness or numbness.  All systems negative/normal/unremarkable except as stated in the HPI  ____________________________________________   PHYSICAL EXAM:  VITAL SIGNS: ED Triage Vitals  Enc Vitals Group     BP 10/31/16 2153 128/90     Pulse Rate 10/31/16 2153 62     Resp 10/31/16 2153 18     Temp 10/31/16 2153 98.5 F (36.9 C)     Temp Source 10/31/16 2153 Oral     SpO2 10/31/16 2153 100 %     Weight 10/31/16 2155 275 lb (124.7 kg)     Height 10/31/16 2155 5\' 9"  (1.753 m)     Head Circumference --      Peak Flow --      Pain Score --      Pain Loc --      Pain Edu? --      Excl. in GC? --     Constitutional: Alert and oriented. Well appearing and in no distress. Eyes: Conjunctivae are normal. Normal extraocular movements. ENT   Head: Normocephalic and atraumatic.   Nose: No congestion/rhinnorhea.   Mouth/Throat: Mucous membranes are moist.   Neck: No stridor. Cardiovascular: Normal rate, regular rhythm. No murmurs, rubs, or gallops. Respiratory: Normal respiratory effort without tachypnea nor retractions. Breath sounds are clear and equal bilaterally. No wheezes/rales/rhonchi. Gastrointestinal: Soft and nontender. Normal bowel  sounds Musculoskeletal: Nontender with normal range of motion in extremities. No lower extremity tenderness nor edema. Neurologic:  Normal speech and language. No gross focal neurologic deficits are appreciated.  Skin:  Skin is warm, dry and intact. No rash noted. Psychiatric: Depressed mood and affect  ____________________________________________  ED COURSE:  Pertinent labs & imaging results that were available during my care of the patient were reviewed by me  and considered in my medical decision making (see chart for details). Patient presents for possible overdose, we will assess with labs and imaging as indicated.   Procedures ____________________________________________   LABS (pertinent positives/negatives)  Labs Reviewed  COMPREHENSIVE METABOLIC PANEL - Abnormal; Notable for the following:       Result Value   Potassium 3.4 (*)    Glucose, Bld 101 (*)    Calcium 8.5 (*)    AST 42 (*)    All other components within normal limits  URINALYSIS, COMPLETE (UACMP) WITH MICROSCOPIC - Abnormal; Notable for the following:    Color, Urine YELLOW (*)    APPearance CLEAR (*)    Squamous Epithelial / LPF 0-5 (*)    All other components within normal limits  ACETAMINOPHEN LEVEL - Abnormal; Notable for the following:    Acetaminophen (Tylenol), Serum <10 (*)    All other components within normal limits  CBC WITH DIFFERENTIAL/PLATELET  TROPONIN I  URINE DRUG SCREEN, QUALITATIVE ([facility] ONLY)  ETHANOL  TSH  SALICYLATE LEVEL   ____________________________________________  FINAL ASSESSMENT AND PLAN  Overdose  Plan: Patient's labs were dictated above. Patient had presented after taking 7 Benadryl and 7 ibuprofen without any adverse outcome. This likely represents a nontoxic ingestion.Patient has been checked out to Dr. Dolores FrameSung for final disposition.   Emily FilbertWilliams, Korvin Valentine E, MD   Note: This note was generated in part or whole with voice recognition software. Voice recognition is usually quite accurate but there are transcription errors that can and very often do occur. I apologize for any typographical errors that were not detected and corrected.     Emily FilbertWilliams, Marsi Turvey E, MD 11/08/16 1501

## 2016-10-31 NOTE — ED Triage Notes (Signed)
Patient presents to Emergency Department via [facility] with complaints of suicidal attempt by swallowing pills: Reports 7 x 25mg  benadryl and 7 x 220mg  Aleve approx 2000.  Pt reports having argument with husband and felt rejected by husband.  Police called by family and reccommended hospital treatment.  Pt NAD, A & O x 4, denies SI att and and has reasons to live for (kids and grandchildren and new career).

## 2016-11-01 NOTE — ED Provider Notes (Signed)
-----------------------------------------   3:34 AM on 11/01/2016 -----------------------------------------  [facility] Dr. Orpah Clintonollin who deems patient is psychiatrically stable for discharge home with outpatient follow-up. He recommends that she continue Celexa as currently prescribed. Strict return precautions given. Patient verbalizes understanding and agrees with plan of care.   Irean HongSung, Stevana Dufner J, MD 11/01/16 [id number]

## 2016-11-01 NOTE — BH Assessment (Signed)
Assessment Note  Deanna Howe is an 46 y.o. female presenting to the ED, via EMS, after swallowing  7 x 25mg  benadryl and 7 x 220mg  Aleve approx 2000.  Pt reports having argument with husband and felt that he did not want to spend time with her. She states her husband is a Naval architecttruck driver and she only sees him every 4 to 6 weeks.  She also reports a lack of intimacy in the marriage.  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  She says she feels like he let her family down.  Pt denies SI and states that she had a fleeting thought  but then reports that she could not actually go through with it because she has reasons to live  (kids and grandchildren and new career.   Past Medical History:  Past Medical History:  Diagnosis Date  . Anxiety   . Depression   . Hypertension   . Peptic ulcer due to Helicobacter pylori     Past Surgical History:  Procedure Laterality Date  . APPENDECTOMY    . CESAREAN SECTION    . CHOLECYSTECTOMY    . INCISION AND DRAINAGE Left 09/06/2012   Procedure: INCISION AND DRAINAGE;  Surgeon: Zelphia CairoGretchen Adkins, MD;  Location: [facility];  Service: Gynecology;  Laterality: Left;  of ovarian cyst  . LAPAROSCOPY N/A 09/06/2012   Procedure: LAPAROSCOPY OPERATIVE;  Surgeon: Zelphia CairoGretchen Adkins, MD;  Location: [facility];  Service: Gynecology;  Laterality: N/A;  . OOPHORECTOMY     RT side    Family History: History reviewed. No pertinent family history.  Social History:  reports that she has never smoked. She has never used smokeless tobacco. She reports that she does not drink alcohol or use drugs.  Additional Social History:  Alcohol / Drug Use Pain Medications: See PTA Prescriptions: See PTA Over the Counter: See PTA History of alcohol / drug use?: No history of alcohol / drug abuse  CIWA: CIWA-Ar BP: 128/90 Pulse Rate: 62 COWS:    Allergies:    Home Medications:  (Not in a hospital admission)  OB/GYN Status:  No LMP  recorded. Patient has had a hysterectomy.  General Assessment Data [facility] of Assessment[facility] Assessment: In system Is this a Tele or Face-to-Face Assessment?: Face-to-Face Is this an Initial Assessment or a Re-assessment for this encounter?: Initial Assessment Marital status: Married [place] n/a Is patient pregnant?: No Pregnancy Status: No Living Arrangements: Spouse/significant other Can pt return to current living arrangement?: Yes Admission Status: Voluntary Is patient capable of signing voluntary admission?: Yes Referral Source: Self/Family/Friend Insurance type: Medical sales representative[employer]igna     Crisis Care Plan Living Arrangements: Spouse/significant other Legal Guardian: Other: (self) Name of Psychiatrist: none reported Name of Therapist: none reported  Education Status Is patient currently in school?: No Current Grade: n/a Highest grade of school patient has completed: some college Name of [facility]: [employer] Contact person: n/a  Risk to self with the past 6 months Suicidal Ideation: Yes-Currently Present Has patient been a risk to self within the past 6 months prior to admission? : No Suicidal Intent: No Has patient had any suicidal intent within the past 6 months prior to admission? : No Is patient at risk for suicide?: No Suicidal Plan?: Yes-Currently Present Has patient had any suicidal plan within the past 6 months prior to admission? : No Specify Current Suicidal Plan: Pt took an overdose of benadryl Access to Means: Yes Specify Access  to Suicidal Means: Pt has acces to pills What has been your use of drugs/alcohol within the last 12 months?: pt denies drug/alcohol use Previous Attempts/Gestures: No How many times?: 0 Other Self Harm Risks: none identified Triggers for Past Attempts: None known Intentional Self Injurious Behavior: None Family Suicide History: No Recent stressful life event(s): Other (Comment) (marital issues) Persecutory voices/beliefs?:  No Depression: Yes Depression Symptoms: Loss of interest in usual pleasures, Feeling worthless/self pity Substance abuse history and/or treatment for substance abuse?: No Suicide prevention information given to non-admitted patients: Not applicable  Risk to Others within the past 6 months Homicidal Ideation: No Does patient have any lifetime risk of violence toward others beyond the six months prior to admission? : No Thoughts of Harm to Others: No Current Homicidal Intent: No Current Homicidal Plan: No Access to Homicidal Means: No Identified Victim: none identified History of harm to others?: No Assessment of Violence: None Noted Violent Behavior Description: none identified Does patient have access to weapons?: No Criminal Charges Pending?: No Does patient have a court date: No Is patient on probation?: No  Psychosis Hallucinations: None noted Delusions: None noted  Mental Status Report Appearance/Hygiene: In scrubs Eye Contact: Fair Motor Activity: Freedom of movement Speech: Logical/coherent Level of Consciousness: Alert Mood: Depressed, Sad Affect: Appropriate to circumstance, Depressed, Sad Anxiety Level: Minimal Thought Processes: Relevant Judgement: Partial Orientation: Person, Place, Time, Situation Obsessive Compulsive Thoughts/Behaviors: None  Cognitive Functioning Concentration: Normal Memory: Recent Intact, Remote Intact IQ: Average Insight: Fair Impulse Control: Fair Appetite: Fair Weight Loss: 0 Weight Gain: 0 Sleep: No Change Vegetative Symptoms: None  ADLScreening [facility] Assessment Services) Patient's cognitive ability adequate to safely complete daily activities?: Yes Patient able to express need for assistance with ADLs?: Yes Independently performs ADLs?: Yes (appropriate for developmental age)  Prior Inpatient Therapy Prior Inpatient Therapy: No Prior Therapy Dates: n/a Prior Therapy Facilty/Provider(s): n/a Reason for Treatment:  n/a  Prior Outpatient Therapy Prior Outpatient Therapy: No Prior Therapy Dates: n/a Prior Therapy Facilty/Provider(s): n/a Reason for Treatment: n/a Does patient have an ACCT team?: No Does patient have Intensive In-House Services?  : No Does patient have [facility] services? : No Does patient have P4CC services?: No  ADL Screening (condition at time of admission) Patient's cognitive ability adequate to safely complete daily activities?: Yes Patient able to express need for assistance with ADLs?: Yes Independently performs ADLs?: Yes (appropriate for developmental age)       Abuse/Neglect Assessment (Assessment to be complete while patient is alone) Physical Abuse: Denies Verbal Abuse: Denies Sexual Abuse: Denies Exploitation of patient/patient's resources: Denies Self-Neglect: Denies Values / Beliefs Cultural Requests During Hospitalization: None Spiritual Requests During Hospitalization: None Consults Spiritual Care Consult Needed: No Social Work Consult Needed: No Merchant navy officer (For Healthcare) Does Patient Have a Medical Advance Directive?: No Would patient like information on creating a medical advance directive?: No - Patient declined    Additional Information 1:1 In Past 12 Months?: No CIRT Risk: No Elopement Risk: No Does patient have medical clearance?: Yes     Disposition:  Disposition Initial Assessment Completed for this Encounter: Yes Disposition of Patient: Other dispositions Other disposition(s): Other (Comment) (Pending [facility] consult)  On Site Evaluation by:   Reviewed with Physician:    [place] 11/01/2016 3:34 AM

## 2016-11-01 NOTE — Discharge Instructions (Signed)
Return to the ER for worsening symptoms, feelings of hurting yourself or others, or other concerns. 

## 2017-01-06 IMAGING — CR DG KNEE COMPLETE 4+V*R*
4 series · 4 of 4 positions shown · non-contrast
Comparison: None.

CLINICAL DATA: Posterior right knee pain for 3 weeks. Worse with
weight-bearing. No known injury.

EXAM:
RIGHT KNEE - COMPLETE 4+ VIEW

[knee ap]
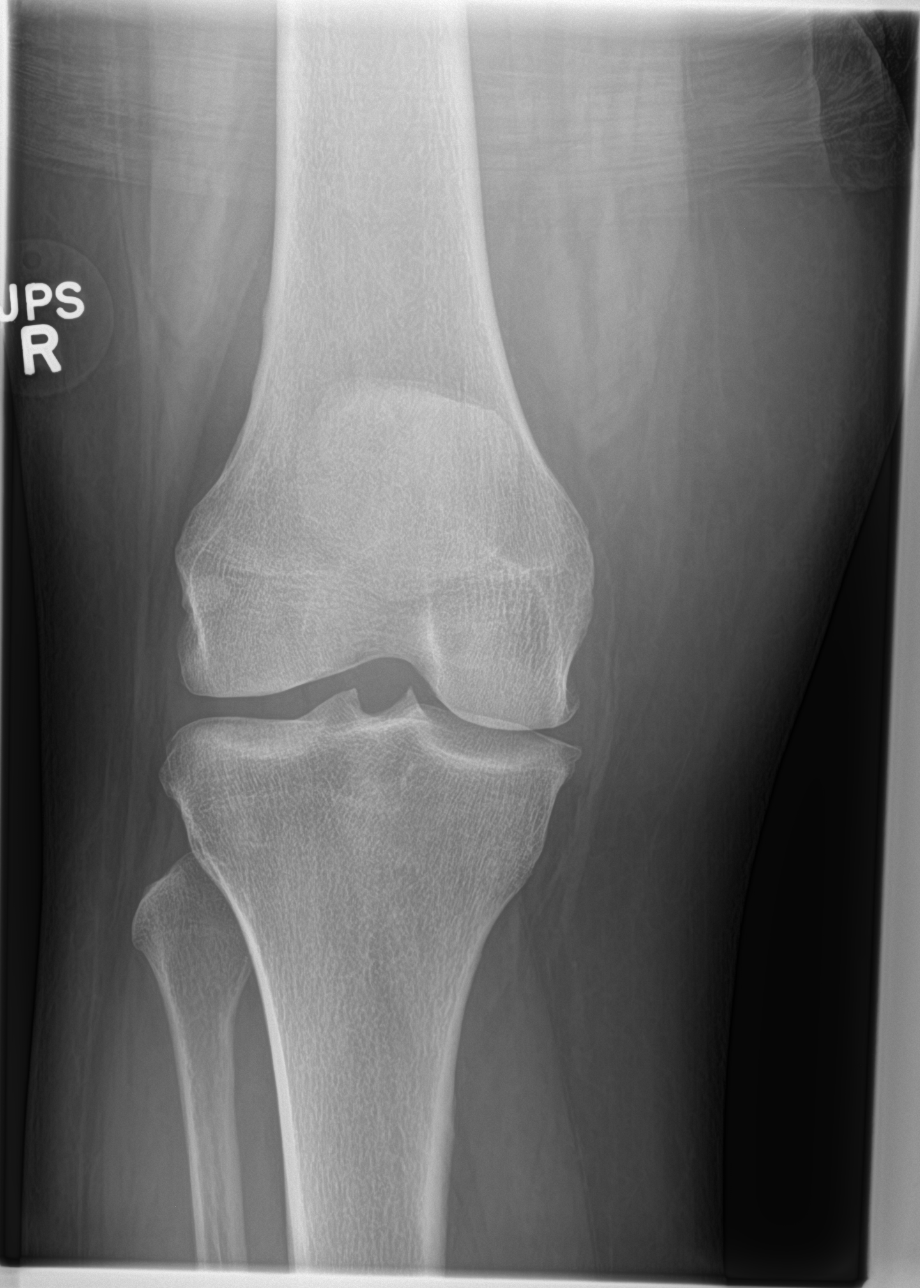

[knee lat]
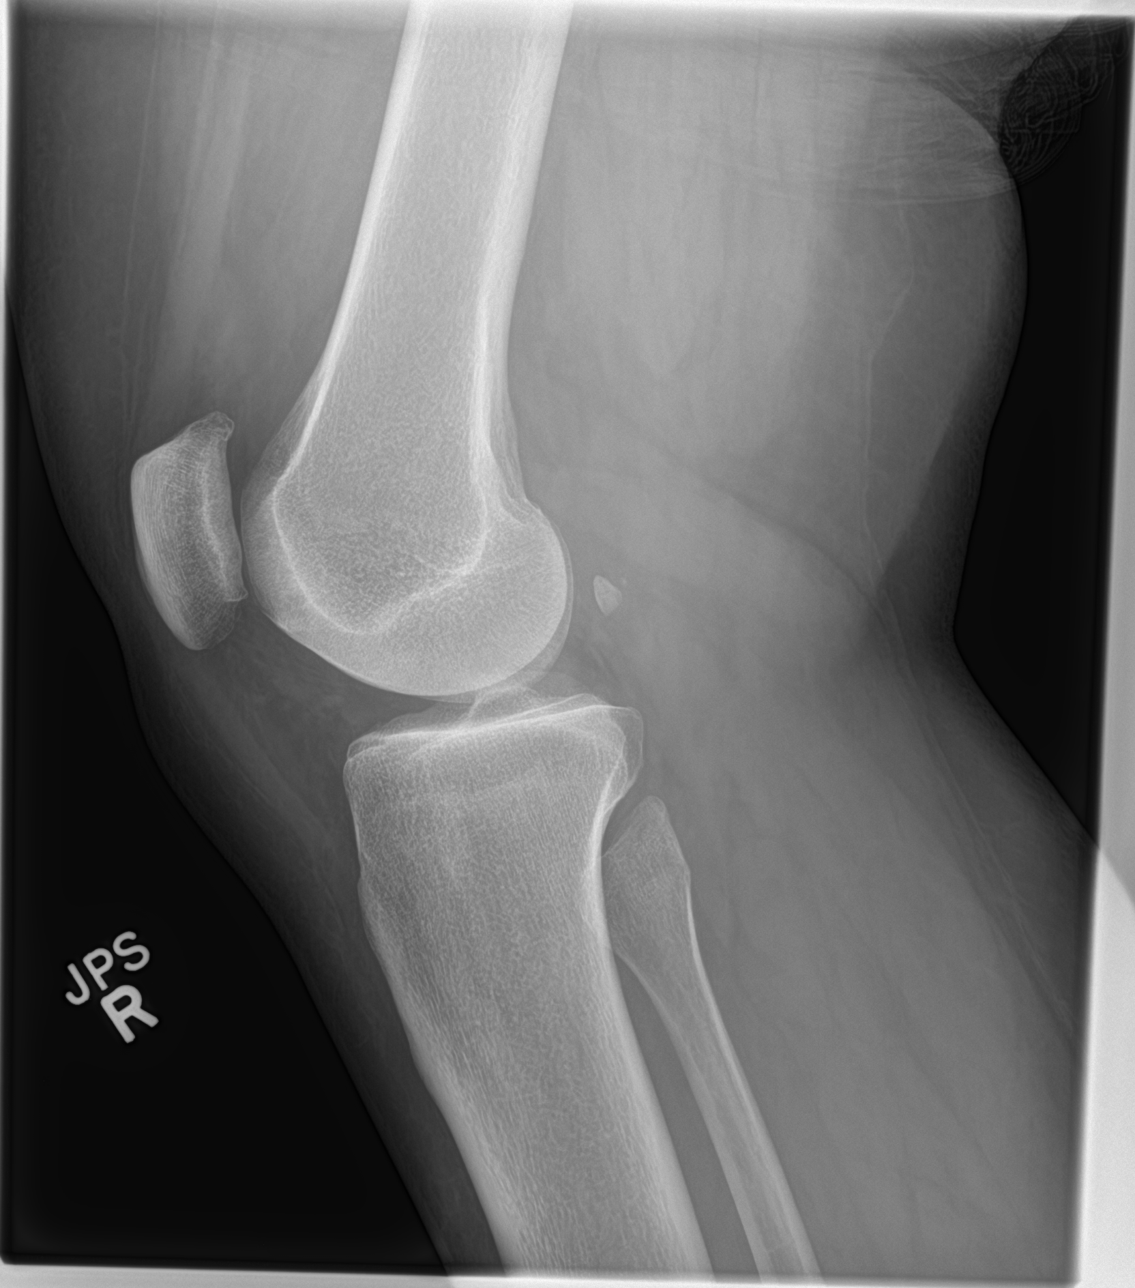

[knee obl (1 of 2)]
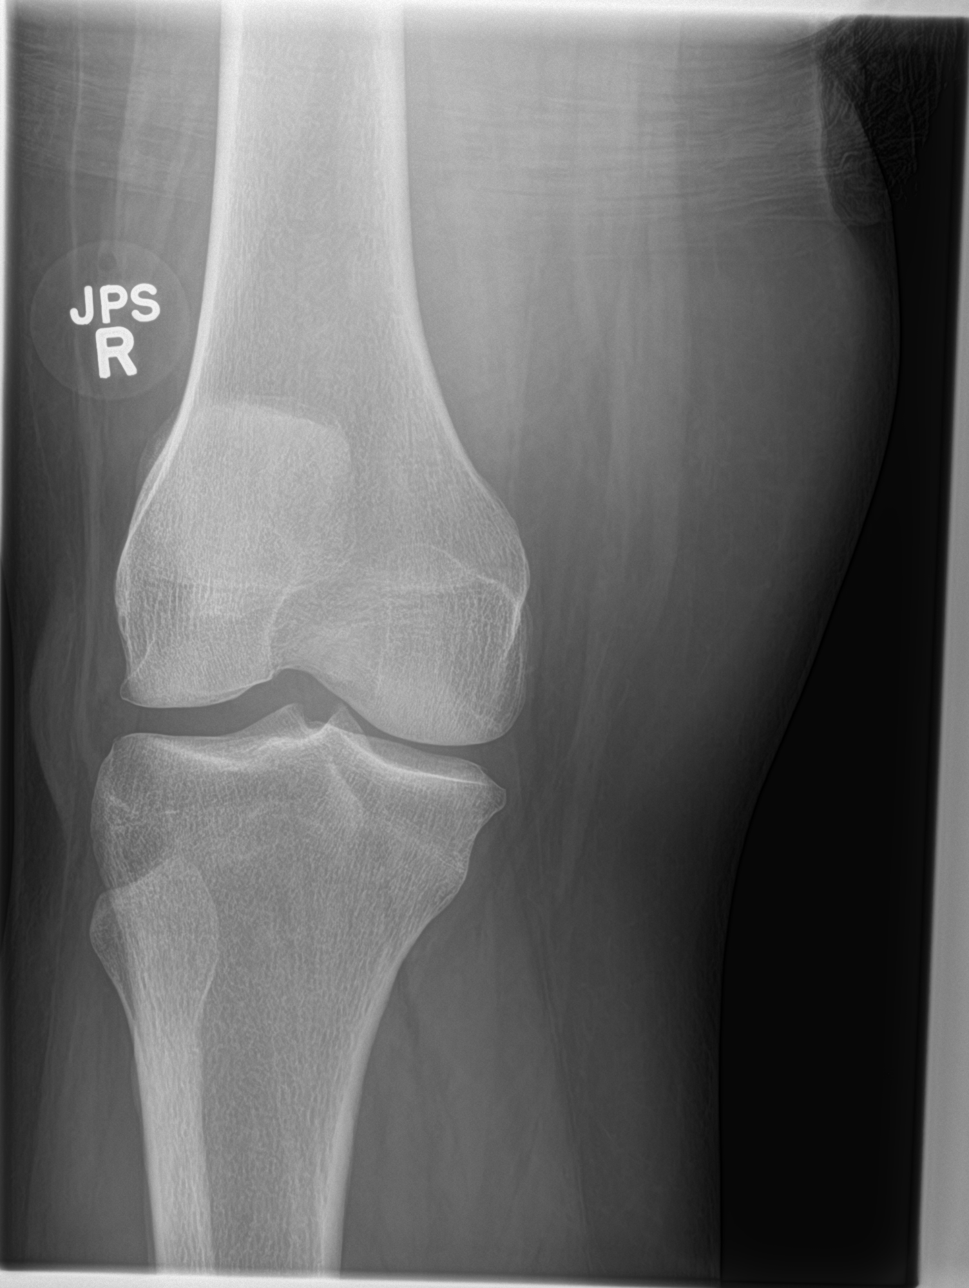

[knee obl (2 of 2)]
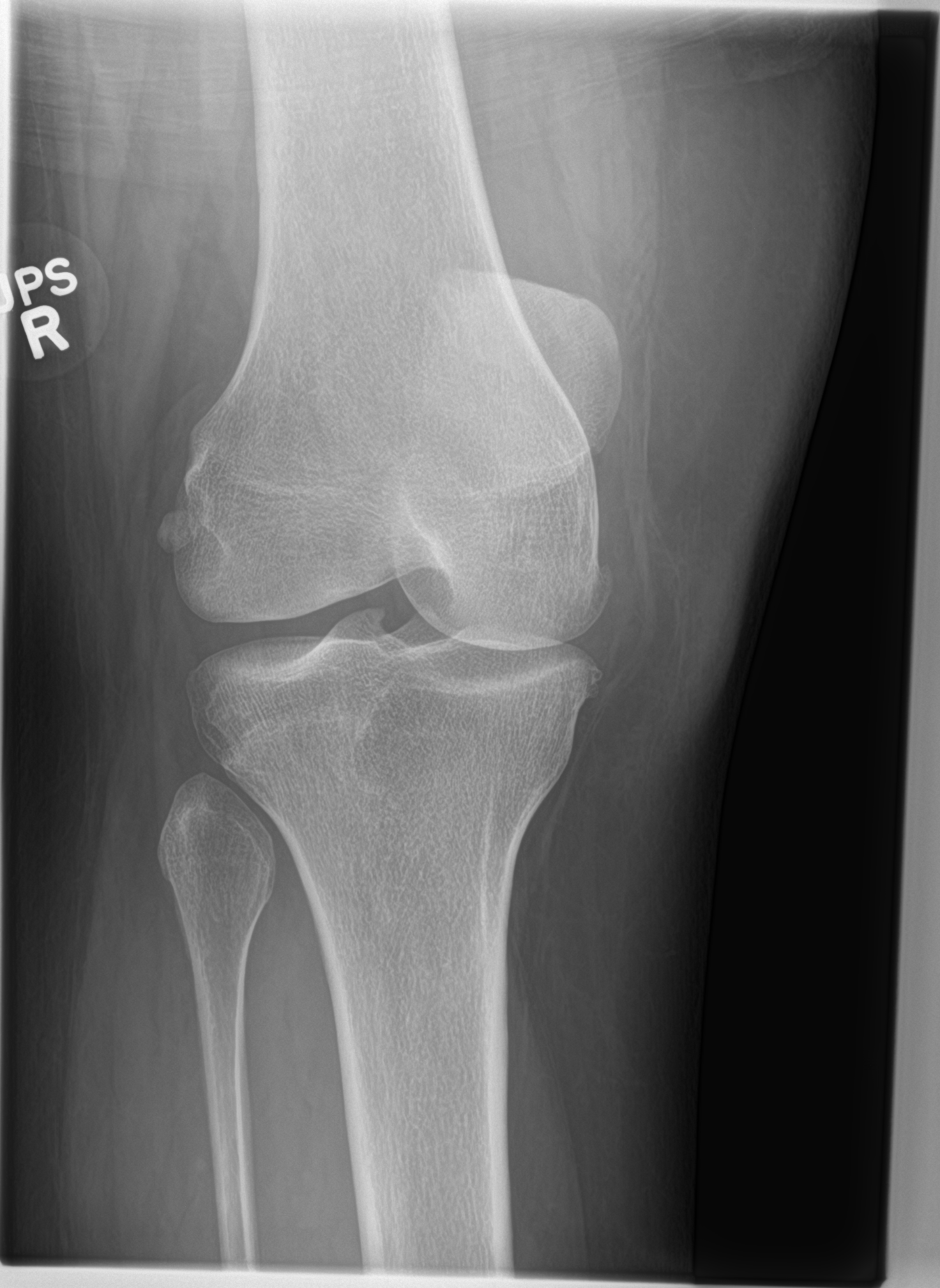

[4 of 4 positions shown; findings below may reference images not displayed]

FINDINGS: There is no evidence of fracture, dislocation, or joint effusion.
Mild degenerative spurring is seen involving the medial compartment
and patella. No evidence of joint space narrowing. No other
significant bone abnormality identified.
IMPRESSION: No acute findings. Mild degenerative spurring of medial compartment
and patella.

## 2017-01-29 ENCOUNTER — Other Ambulatory Visit: Payer: Self-pay | Admitting: Family Medicine

## 2017-01-29 DIAGNOSIS — Z1231 Encounter for screening mammogram for malignant neoplasm of breast: Secondary | ICD-10-CM

## 2017-07-28 ENCOUNTER — Other Ambulatory Visit: Payer: Self-pay

## 2017-07-28 ENCOUNTER — Emergency Department (HOSPITAL_COMMUNITY)
Admission: EM | Admit: 2017-07-28 | Discharge: 2017-07-28 | Disposition: A | Discharge status: Home / Self Care | Payer: Managed Care, Other (non HMO) | Attending: Emergency Medicine | Admitting: Emergency Medicine

## 2017-07-28 ENCOUNTER — Encounter (HOSPITAL_COMMUNITY): Payer: Self-pay | Admitting: Neurology

## 2017-07-28 ENCOUNTER — Emergency Department (HOSPITAL_COMMUNITY): Payer: Managed Care, Other (non HMO)

## 2017-07-28 DIAGNOSIS — Z79899 Other long term (current) drug therapy: Secondary | ICD-10-CM | POA: Insufficient documentation

## 2017-07-28 DIAGNOSIS — R110 Nausea: Secondary | ICD-10-CM | POA: Insufficient documentation

## 2017-07-28 DIAGNOSIS — R519 Headache, unspecified: Secondary | ICD-10-CM

## 2017-07-28 DIAGNOSIS — I10 Essential (primary) hypertension: Secondary | ICD-10-CM

## 2017-07-28 DIAGNOSIS — R51 Headache: Secondary | ICD-10-CM | POA: Insufficient documentation

## 2017-07-28 DIAGNOSIS — Z91040 Latex allergy status: Secondary | ICD-10-CM | POA: Insufficient documentation

## 2017-07-28 LAB — CBC WITH DIFFERENTIAL/PLATELET
BASOS PCT: 0 %
Basophils Absolute: 0 10*3/uL (ref 0.0–0.1)
Eosinophils Absolute: 0.1 10*3/uL (ref 0.0–0.7)
Eosinophils Relative: 1 %
HEMATOCRIT: 39.7 % (ref 36.0–46.0)
HEMOGLOBIN: 13 g/dL (ref 12.0–15.0)
LYMPHS PCT: 36 %
Lymphs Abs: 2.4 10*3/uL (ref 0.7–4.0)
MCH: 29.7 pg (ref 26.0–34.0)
MCHC: 32.7 g/dL (ref 30.0–36.0)
MCV: 90.6 fL (ref 78.0–100.0)
Monocytes Absolute: 0.4 10*3/uL (ref 0.1–1.0)
Monocytes Relative: 7 %
NEUTROS ABS: 3.8 10*3/uL (ref 1.7–7.7)
NEUTROS PCT: 56 %
Platelets: 307 10*3/uL (ref 150–400)
RBC: 4.38 MIL/uL (ref 3.87–5.11)
RDW: 13.6 % (ref 11.5–15.5)
WBC: 6.8 10*3/uL (ref 4.0–10.5)

## 2017-07-28 LAB — I-STAT CHEM 8, ED
BUN: 7 mg/dL (ref 6–20)
Calcium, Ion: 1.15 mmol/L (ref 1.15–1.40)
Chloride: 102 mmol/L (ref 101–111)
Creatinine, Ser: 0.8 mg/dL (ref 0.44–1.00)
Glucose, Bld: 110 mg/dL — ABNORMAL HIGH (ref 65–99)
HEMATOCRIT: 40 % (ref 36.0–46.0)
HEMOGLOBIN: 13.6 g/dL (ref 12.0–15.0)
Potassium: 3.7 mmol/L (ref 3.5–5.1)
Sodium: 140 mmol/L (ref 135–145)
TCO2: 29 mmol/L (ref 22–32)

## 2017-07-28 MED ORDER — PROMETHAZINE HCL 25 MG/ML IJ SOLN
12.5000 mg | Freq: Once | INTRAMUSCULAR | Status: AC
  Administered 2017-07-28: 12.5 mg via INTRAVENOUS
  Filled 2017-07-28: qty 1

## 2017-07-28 MED ORDER — KETOROLAC TROMETHAMINE 30 MG/ML IJ SOLN
30.0000 mg | Freq: Once | INTRAMUSCULAR | Status: AC
  Administered 2017-07-28: 30 mg via INTRAVENOUS
  Filled 2017-07-28: qty 1

## 2017-07-28 MED ORDER — IOPAMIDOL (ISOVUE-370) INJECTION 76%
50.0000 mL | Freq: Once | INTRAVENOUS | Status: AC | PRN
  Administered 2017-07-28: 50 mL via INTRAVENOUS

## 2017-07-28 MED ORDER — IOPAMIDOL (ISOVUE-370) INJECTION 76%
INTRAVENOUS | Status: AC
  Filled 2017-07-28: qty 50

## 2017-07-28 MED ORDER — SODIUM CHLORIDE 0.9 % IV BOLUS
500.0000 mL | Freq: Once | INTRAVENOUS | Status: AC
  Administered 2017-07-28: 500 mL via INTRAVENOUS

## 2017-07-28 NOTE — ED Triage Notes (Signed)
Per ems- pt comes from school, she was sitting at her desk, she got a h/a around 10, went to the bathroom, was feeling dizzy. Someone checked her BP and it was over 200 she said. Has hx of HTN, takes HCTZ didn't take meds today. Photophobia, BP 173/93, HR 77, CBG 124. Is a x 4.

## 2017-07-28 NOTE — ED Notes (Signed)
Pt is ambulatory to restroom with assistance.  Unsteady gait noted.  Pt becomes more dizzy upon ambulation or from a lying to sitting position.

## 2017-07-28 NOTE — ED Notes (Signed)
Pt returned from CT at this time.  

## 2017-07-28 NOTE — ED Provider Notes (Signed)
[facility] EMERGENCY DEPARTMENT Provider Note   CSN: [id number] Arrival date & time: 07/28/17  1100     History   Chief Complaint Chief Complaint  Patient presents with  . Headache    HPI Laritza Lorene Dy is a 47 y.o. female.  Patient is a 47 year old female with a history of anxiety depression and hypertension who presents with a headache.  She states that she was sitting in class and had been a little nauseated throughout the morning.  She felt like her blood pressure was elevated.  She stood up to walk to the bathroom and had a sudden onset of a headache which she describes as feeling as blood rushing to her head.  Is around a 10 out of 10 pain at that time.  Currently is down to a 5 out of 10.  She has some slight throbbing to her right temple area.  She has some nausea but no vomiting.  No photophobia.  No vision changes.  Her blood pressure was markedly elevated on scene but has improved.  She denies any numbness or weakness to her extremities.  She denies any slurred speech.  No dizziness.  She has not had similar symptoms in the past.  She does report that she has been under a lot of stress recently with work and going to school.     Past Medical History:  Diagnosis Date  . Anxiety   . Depression   . Hypertension   . Peptic ulcer due to Helicobacter pylori     There are no active problems to display for this patient.   Past Surgical History:  Procedure Laterality Date  . APPENDECTOMY    . CESAREAN SECTION    . CHOLECYSTECTOMY    . INCISION AND DRAINAGE Left 09/06/2012   Procedure: INCISION AND DRAINAGE;  Surgeon: Zelphia Cairo, MD;  Location: [facility];  Service: Gynecology;  Laterality: Left;  of ovarian cyst  . LAPAROSCOPY N/A 09/06/2012   Procedure: LAPAROSCOPY OPERATIVE;  Surgeon: Zelphia Cairo, MD;  Location: [facility];  Service: Gynecology;  Laterality: N/A;  . OOPHORECTOMY     RT side     OB History   None      Home Medications     Prior to Admission medications   Medication Sig Start Date End Date Taking? Authorizing Provider  bimatoprost (LUMIGAN) 0.03 % ophthalmic solution Place 1 drop into both eyes at bedtime.   Yes [provider]  hydrochlorothiazide (HYDRODIURIL) 25 MG tablet Take 25 mg by mouth daily.   Yes [provider]  Multiple Vitamins-Minerals (MULTIVITAMIN WITH MINERALS) tablet Take 1 tablet by mouth daily.   Yes [provider]  venlafaxine XR (EFFEXOR-XR) 75 MG 24 hr capsule Take 75 mg by mouth daily with breakfast.   Yes [provider]  EPINEPHrine 0.3 mg/0.3 mL IJ SOAJ injection Inject 0.3 mg into the muscle once.  02/21/13   [provider]  ondansetron (ZOFRAN ODT) 4 MG disintegrating tablet Take 1 tablet (4 mg total) by mouth every 8 (eight) hours as needed for nausea. Patient not taking: Reported on 06/18/2015 03/07/15   Garlon Hatchet, PA-C  traMADol (ULTRAM) 50 MG tablet Take 1 tablet (50 mg total) by mouth 2 (two) times daily. Patient not taking: Reported on 03/07/2015 05/17/14   Vivi Barrack, DPM    Family History No family history on file.  Social History Social History   Tobacco Use  . Smoking status: Never Smoker  .  Smokeless tobacco: Never Used  Substance Use Topics  . Alcohol use: No  . Drug use: No     Allergies   Codeine; Dilaudid [hydromorphone hcl]; Hydrocodone; Latex; Penicillins; Adhesive [tape]; and Morphine and related   Review of Systems Review of Systems  Constitutional: Negative for chills, diaphoresis, fatigue and fever.  HENT: Negative for congestion, rhinorrhea and sneezing.   Eyes: Negative.   Respiratory: Negative for cough, chest tightness and shortness of breath.   Cardiovascular: Negative for chest pain and leg swelling.  Gastrointestinal: Positive for nausea. Negative for abdominal pain, blood in stool, diarrhea and vomiting.  Genitourinary: Negative for difficulty urinating, flank pain, frequency  and hematuria.  Musculoskeletal: Negative for arthralgias and back pain.  Skin: Negative for rash.  Neurological: Positive for headaches. Negative for dizziness, speech difficulty, weakness and numbness.     Physical Exam Updated Vital Signs BP (!) 139/106   Pulse (!) 58   Temp 98.3 F (36.8 C) (Oral)   Resp 18   Ht 5' 9.5" (1.765 m)   Wt 124.7 kg (275 lb)   SpO2 100%   BMI 40.03 kg/m   Physical Exam  Constitutional: She is oriented to person, place, and time. She appears well-developed and well-nourished.  HENT:  Head: Normocephalic and atraumatic.  Eyes: Pupils are equal, round, and reactive to light.  Neck: Normal range of motion. Neck supple.  Cardiovascular: Normal rate, regular rhythm and normal heart sounds.  Pulmonary/Chest: Effort normal and breath sounds normal. No respiratory distress. She has no wheezes. She has no rales. She exhibits no tenderness.  Abdominal: Soft. Bowel sounds are normal. There is no tenderness. There is no rebound and no guarding.  Musculoskeletal: Normal range of motion. She exhibits no edema.  Lymphadenopathy:    She has no cervical adenopathy.  Neurological: She is alert and oriented to person, place, and time.  Motor 5/5 all extremities Sensation grossly intact to LT all extremities Finger to Nose intact, no pronator drift CN II-XII grossly intact Visual fields full to confrontation   Skin: Skin is warm and dry. No rash noted.  Psychiatric: She has a normal mood and affect.     ED Treatments / Results  Labs (all labs ordered are listed, but only abnormal results are displayed) Labs Reviewed  I-STAT CHEM 8, ED - Abnormal; Notable for the following components:      Result Value   Glucose, Bld 110 (*)    All other components within normal limits  CBC WITH DIFFERENTIAL/PLATELET    EKG None  Radiology Ct Angio Head W/cm &/or Wo Cm  Result Date: 07/28/2017 CLINICAL DATA:  Acute onset of severe headache with subsequent  dizziness and hypertension. EXAM: CT ANGIOGRAPHY HEAD AND NECK TECHNIQUE: Multidetector CT imaging of the head and neck was performed using the standard protocol during bolus administration of intravenous contrast. Multiplanar CT image reconstructions and MIPs were obtained to evaluate the vascular anatomy. Carotid stenosis measurements (when applicable) are obtained utilizing NASCET criteria, using the distal internal carotid diameter as the denominator. CONTRAST:  50mL ISOVUE-370 IOPAMIDOL (ISOVUE-370) INJECTION 76% COMPARISON:  None. FINDINGS: CT HEAD FINDINGS Brain: The brain shows a normal appearance without evidence of malformation, atrophy, old or acute small or large vessel infarction, mass lesion, hemorrhage, hydrocephalus or extra-axial collection. Vascular: No hyperdense vessel. No evidence of atherosclerotic calcification. Skull: Normal.  No traumatic finding.  No focal bone lesion. Sinuses/Orbits: Sinuses are clear. Orbits appear normal. Mastoids are clear. Other: None significant CTA NECK FINDINGS Aortic  arch: Normal Right carotid system: Normal Left carotid system: Normal Vertebral arteries: Normal Skeleton: Ordinary spondylosis C6-7. Other neck: No mass or lymphadenopathy. Upper chest: Negative Review of the MIP images confirms the above findings CTA HEAD FINDINGS Anterior circulation: Both internal carotid arteries are widely patent through the skull base and siphon regions. The anterior and middle cerebral vessels are patent and normal without stenosis, aneurysm or vascular malformation. Posterior circulation: Both vertebral arteries are patent through the foramen magnum to the basilar. No basilar stenosis. Posterior circulation branch vessels are normal. Venous sinuses: Patent and normal. Anatomic variants: None significant. Delayed phase: Abnormal enhancement. Review of the MIP images confirms the above findings IMPRESSION: Normal examinations. No subarachnoid hemorrhage is visible. No cause  headache is identified. No abnormal vascular finding. Electronically Signed   By: Paulina Fusi M.D.   On: 07/28/2017 12:57   Ct Angio Neck W And/or Wo Contrast  Result Date: 07/28/2017 CLINICAL DATA:  Acute onset of severe headache with subsequent dizziness and hypertension. EXAM: CT ANGIOGRAPHY HEAD AND NECK TECHNIQUE: Multidetector CT imaging of the head and neck was performed using the standard protocol during bolus administration of intravenous contrast. Multiplanar CT image reconstructions and MIPs were obtained to evaluate the vascular anatomy. Carotid stenosis measurements (when applicable) are obtained utilizing NASCET criteria, using the distal internal carotid diameter as the denominator. CONTRAST:  50mL ISOVUE-370 IOPAMIDOL (ISOVUE-370) INJECTION 76% COMPARISON:  None. FINDINGS: CT HEAD FINDINGS Brain: The brain shows a normal appearance without evidence of malformation, atrophy, old or acute small or large vessel infarction, mass lesion, hemorrhage, hydrocephalus or extra-axial collection. Vascular: No hyperdense vessel. No evidence of atherosclerotic calcification. Skull: Normal.  No traumatic finding.  No focal bone lesion. Sinuses/Orbits: Sinuses are clear. Orbits appear normal. Mastoids are clear. Other: None significant CTA NECK FINDINGS Aortic arch: Normal Right carotid system: Normal Left carotid system: Normal Vertebral arteries: Normal Skeleton: Ordinary spondylosis C6-7. Other neck: No mass or lymphadenopathy. Upper chest: Negative Review of the MIP images confirms the above findings CTA HEAD FINDINGS Anterior circulation: Both internal carotid arteries are widely patent through the skull base and siphon regions. The anterior and middle cerebral vessels are patent and normal without stenosis, aneurysm or vascular malformation. Posterior circulation: Both vertebral arteries are patent through the foramen magnum to the basilar. No basilar stenosis. Posterior circulation branch vessels are  normal. Venous sinuses: Patent and normal. Anatomic variants: None significant. Delayed phase: Abnormal enhancement. Review of the MIP images confirms the above findings IMPRESSION: Normal examinations. No subarachnoid hemorrhage is visible. No cause headache is identified. No abnormal vascular finding. Electronically Signed   By: Paulina Fusi M.D.   On: 07/28/2017 12:57    Procedures Procedures (including critical care time)  Medications Ordered in ED Medications  iopamidol (ISOVUE-370) 76 % injection (has no administration in time range)  promethazine (PHENERGAN) injection 12.5 mg (12.5 mg Intravenous Given 07/28/17 1327)  iopamidol (ISOVUE-370) 76 % injection 50 mL (50 mLs Intravenous Contrast Given 07/28/17 1233)  sodium chloride 0.9 % bolus 500 mL (500 mLs Intravenous New Bag/Given 07/28/17 1327)  ketorolac (TORADOL) 30 MG/ML injection 30 mg (30 mg Intravenous Given 07/28/17 1328)     Initial Impression / Assessment and Plan / ED Course  I have reviewed the triage vital signs and the nursing notes.  Pertinent labs & imaging results that were available during my care of the patient were reviewed by me and considered in my medical decision making (see chart for details).  Patient is a 47 year old female with a history of hypertension who presents with a sudden onset of a headache associated with elevated blood pressure.  She has no neurologic deficits.  Given her symptoms, CT of her head was performed along with a CTA.  There is no evidence of subarachnoid hemorrhage.  No evidence of aneurysm.  No other acute abnormalities.  She was given a dose of Phenergan and Toradol here in the ED and is feeling much better.  Her headache is still mild but has much improved.  Her blood pressure has improved.  She is able to ambulate without ataxia.  She was discharged home in good condition.  She was encouraged to follow-up with her PCP.  Return precautions were given.  Final Clinical Impressions(s) / ED  Diagnoses   Final diagnoses:  Bad headache  Hypertension, unspecified type    ED Discharge Orders    None       Rolan Bucco, MD 07/28/17 1428

## 2017-07-28 NOTE — ED Notes (Signed)
Pt ambulated to the bathroom and back.  Pt endorsed dizziness while ambulating.

## 2017-10-06 DIAGNOSIS — L209 Atopic dermatitis, unspecified: Secondary | ICD-10-CM | POA: Insufficient documentation

## 2017-12-04 ENCOUNTER — Emergency Department: Payer: PRIVATE HEALTH INSURANCE

## 2017-12-04 ENCOUNTER — Emergency Department
Admission: EM | Admit: 2017-12-04 | Discharge: 2017-12-04 | Disposition: A | Discharge status: Home / Self Care | Payer: PRIVATE HEALTH INSURANCE | Attending: Student in an Organized Health Care Education/Training Program | Admitting: Student in an Organized Health Care Education/Training Program

## 2017-12-04 ENCOUNTER — Other Ambulatory Visit: Payer: Self-pay

## 2017-12-04 DIAGNOSIS — Z91040 Latex allergy status: Secondary | ICD-10-CM | POA: Insufficient documentation

## 2017-12-04 DIAGNOSIS — Z79899 Other long term (current) drug therapy: Secondary | ICD-10-CM | POA: Insufficient documentation

## 2017-12-04 DIAGNOSIS — I10 Essential (primary) hypertension: Secondary | ICD-10-CM | POA: Diagnosis not present

## 2017-12-04 DIAGNOSIS — R0789 Other chest pain: Secondary | ICD-10-CM | POA: Insufficient documentation

## 2017-12-04 DIAGNOSIS — R079 Chest pain, unspecified: Secondary | ICD-10-CM | POA: Diagnosis present

## 2017-12-04 LAB — TROPONIN I
Troponin I: <0.03 ng/mL (ref <0.03)
Troponin I: <0.03 ng/mL (ref <0.03)

## 2017-12-04 LAB — BASIC METABOLIC PANEL
Anion gap: 5 (ref 5–15)
BUN: 11 mg/dL (ref 6–20)
CHLORIDE: 104 mmol/L (ref 98–111)
CO2: 29 mmol/L (ref 22–32)
Calcium: 8.9 mg/dL (ref 8.9–10.3)
Creatinine, Ser: 0.76 mg/dL (ref 0.44–1.00)
GFR calc Af Amer: >60 mL/min (ref >60)
GFR calc non Af Amer: >60 mL/min (ref >60)
GLUCOSE: 135 mg/dL — AB (ref 70–99)
POTASSIUM: 3.9 mmol/L (ref 3.5–5.1)
Sodium: 138 mmol/L (ref 135–145)

## 2017-12-04 LAB — CBC
HEMATOCRIT: 35.6 % (ref 35.0–47.0)
Hemoglobin: 12.2 g/dL (ref 12.0–16.0)
MCH: 30.6 pg (ref 26.0–34.0)
MCHC: 34.3 g/dL (ref 32.0–36.0)
MCV: 89.4 fL (ref 80.0–100.0)
PLATELETS: 328 10*3/uL (ref 150–440)
RBC: 3.98 MIL/uL (ref 3.80–5.20)
RDW: 13.8 % (ref 11.5–14.5)
WBC: 9.1 10*3/uL (ref 3.6–11.0)

## 2017-12-04 NOTE — ED Provider Notes (Signed)
[facility] Emergency Department Provider Note    First MD Initiated Contact with Patient 12/04/17 1654     (approximate)  I have reviewed the triage vital signs and the nursing notes.   HISTORY  Chief Complaint Chest Pain    HPI Deanna Howe is a 47 y.o. female history of anxiety depression hypertension presents with intermittent brief episodes of chest pain over the past week with one episode that occurred while in class today at around 11:00.  States that episodes are brief in nature roughly 2 to 5 minutes and feels like electricity going to her chest.  No pain radiating through to her back.  Denies any nausea or vomiting.  She is pain-free right now.  No shortness of breath.  No recent surgeries.  Does not use birth control.  She does not smoke.    Past Medical History:  Diagnosis Date  . Anxiety   . Depression   . Hypertension   . Peptic ulcer due to Helicobacter pylori    No family history on file. Past Surgical History:  Procedure Laterality Date  . ABDOMINAL HYSTERECTOMY    . APPENDECTOMY    . CESAREAN SECTION    . CHOLECYSTECTOMY    . INCISION AND DRAINAGE Left 09/06/2012   Procedure: INCISION AND DRAINAGE;  Surgeon: Zelphia Cairo, MD;  Location: [facility];  Service: Gynecology;  Laterality: Left;  of ovarian cyst  . LAPAROSCOPY N/A 09/06/2012   Procedure: LAPAROSCOPY OPERATIVE;  Surgeon: Zelphia Cairo, MD;  Location: [facility];  Service: Gynecology;  Laterality: N/A;  . OOPHORECTOMY     RT side   There are no active problems to display for this patient.     Prior to Admission medications   Medication Sig Start Date End Date Taking? Authorizing Provider  bimatoprost (LUMIGAN) 0.03 % ophthalmic solution Place 1 drop into both eyes at bedtime.    [provider]  EPINEPHrine 0.3 mg/0.3 mL IJ SOAJ injection Inject 0.3 mg into the muscle once.  02/21/13   [provider]  hydrochlorothiazide (HYDRODIURIL) 25 MG tablet  Take 25 mg by mouth daily.    [provider]  Multiple Vitamins-Minerals (MULTIVITAMIN WITH MINERALS) tablet Take 1 tablet by mouth daily.    [provider]  ondansetron (ZOFRAN ODT) 4 MG disintegrating tablet Take 1 tablet (4 mg total) by mouth every 8 (eight) hours as needed for nausea. Patient not taking: Reported on 06/18/2015 03/07/15   Garlon Hatchet, PA-C  traMADol (ULTRAM) 50 MG tablet Take 1 tablet (50 mg total) by mouth 2 (two) times daily. Patient not taking: Reported on 03/07/2015 05/17/14   Vivi Barrack, DPM  venlafaxine XR (EFFEXOR-XR) 75 MG 24 hr capsule Take 75 mg by mouth daily with breakfast.    [provider]    Allergies Codeine; Dilaudid [hydromorphone hcl]; Hydrocodone; Latex; Penicillins; Adhesive [tape]; and Morphine and related    Social History Social History   Tobacco Use  . Smoking status: Never Smoker  . Smokeless tobacco: Never Used  Substance Use Topics  . Alcohol use: No  . Drug use: No    Review of Systems Patient denies headaches, rhinorrhea, blurry vision, numbness, shortness of breath, chest pain, edema, cough, abdominal pain, nausea, vomiting, diarrhea, dysuria, fevers, rashes or hallucinations unless otherwise stated above in HPI. ____________________________________________   PHYSICAL EXAM:  VITAL SIGNS: Vitals:   12/04/17 1431  BP: 122/72  Pulse: 81  Resp: 18  Temp: 98.8 F (37.1 C)  SpO2: 100%    Constitutional: Alert and oriented.  Eyes: Conjunctivae are normal.  Head: Atraumatic. Nose: No congestion/rhinnorhea. Mouth/Throat: Mucous membranes are moist.   Neck: No stridor. Painless ROM.  Cardiovascular: Normal rate, regular rhythm. Grossly normal heart sounds.  Good peripheral circulation. Respiratory: Normal respiratory effort.  No retractions. Lungs CTAB. Gastrointestinal: Soft and nontender. No distention. No abdominal bruits. No CVA tenderness. Genitourinary:  Musculoskeletal: No lower  extremity tenderness nor edema.  No joint effusions. Neurologic:  Normal speech and language. No gross focal neurologic deficits are appreciated. No facial droop Skin:  Skin is warm, dry and intact. No rash noted. Psychiatric: Mood and affect are normal. Speech and behavior are normal.  ____________________________________________   LABS (all labs ordered are listed, but only abnormal results are displayed)  Results for orders placed or performed during the hospital encounter of 12/04/17 (from the past 24 hour(s))  Basic metabolic panel     Status: Abnormal   Collection Time: 12/04/17  2:32 PM  Result Value Ref Range   Sodium 138 135 - 145 mmol/L   Potassium 3.9 3.5 - 5.1 mmol/L   Chloride 104 98 - 111 mmol/L   CO2 29 22 - 32 mmol/L   Glucose, Bld 135 (H) 70 - 99 mg/dL   BUN 11 6 - 20 mg/dL   Creatinine, Ser 2.95 0.44 - 1.00 mg/dL   Calcium 8.9 8.9 - 62.1 mg/dL   GFR calc non Af Amer >60 >60 mL/min   GFR calc Af Amer >60 >60 mL/min   Anion gap 5 5 - 15  CBC     Status: None   Collection Time: 12/04/17  2:32 PM  Result Value Ref Range   WBC 9.1 3.6 - 11.0 K/uL   RBC 3.98 3.80 - 5.20 MIL/uL   Hemoglobin 12.2 12.0 - 16.0 g/dL   HCT 30.8 65.7 - 84.6 %   MCV 89.4 80.0 - 100.0 fL   MCH 30.6 26.0 - 34.0 pg   MCHC 34.3 32.0 - 36.0 g/dL   RDW 96.2 95.2 - 84.1 %   Platelets 328 150 - 440 K/uL  Troponin I     Status: None   Collection Time: 12/04/17  2:32 PM  Result Value Ref Range   Troponin I <0.03 <0.03 ng/mL   ____________________________________________  EKG My review and personal interpretation at Time: 14:25   Indication: chest pain  Rate: 85  Rhythm: sinus Axis: normal Other: normal intervals, no stemi ____________________________________________  RADIOLOGY  I personally reviewed all radiographic images ordered to evaluate for the above acute complaints and reviewed radiology reports and findings.  These findings were personally discussed with the patient.  Please see  medical record for radiology report.  ____________________________________________   PROCEDURES  Procedure(s) performed:  Procedures    Critical Care performed: no ____________________________________________   INITIAL IMPRESSION / ASSESSMENT AND PLAN / ED COURSE  Pertinent labs & imaging results that were available during my care of the patient were reviewed by me and considered in my medical decision making (see chart for details).   DDX: ACS, pericarditis, esophagitis, boerhaaves, pe, dissection, pna, bronchitis, costochondritis   Calia Judie Petit Cathlean Sauer is a 47 y.o. who presents to the ED with discomfort as described above.  Seems very atypical for ACS.  Not clinically consistent with dissection.  She is low risk by Wells criteria and is PERC negative.  Chest x-ray shows no evidence of pneumothorax or consolidation.  The patient will be placed on continuous pulse oximetry and  telemetry for monitoring.  Laboratory evaluation will be sent to evaluate for the above complaints.    Clinical Course as of Dec 04 1948  Fri Dec 04, 2017  [id number] Troponin negative.  Patient observed on the monitor with no dysrhythmia or ectopy.  At this point do believe patient stable and appropriate for outpatient follow-up.   [PR]    Clinical Course User Index [PR] Willy Eddyobinson, [place], MD     As part of my medical decision making, I reviewed the following data within the electronic MEDICAL RECORD NUMBER Nursing notes reviewed and incorporated, Labs reviewed, notes from prior ED visits   ____________________________________________   FINAL CLINICAL IMPRESSION(S) / ED DIAGNOSES  Final diagnoses:  Atypical chest pain      NEW MEDICATIONS STARTED DURING THIS VISIT:  New Prescriptions   No medications on file     Note:  This document was prepared using Dragon voice recognition software and may include unintentional dictation errors.    Willy Eddyobinson, Lindell Tussey, MD 12/04/17 [phone]

## 2017-12-04 NOTE — ED Triage Notes (Signed)
Pt c/o intermittent sharp stabbing chest pain for the past 2 week. States she is just tired. Denies SOb, N/V/Diaphoresis

## 2018-07-08 ENCOUNTER — Ambulatory Visit (HOSPITAL_COMMUNITY)
Admission: EM | Admit: 2018-07-08 | Discharge: 2018-07-08 | Disposition: A | Discharge status: Home / Self Care | Payer: Self-pay | Attending: Family Medicine | Admitting: Family Medicine

## 2018-07-08 ENCOUNTER — Other Ambulatory Visit: Payer: Self-pay

## 2018-07-08 ENCOUNTER — Encounter (HOSPITAL_COMMUNITY): Payer: Self-pay | Admitting: Emergency Medicine

## 2018-07-08 DIAGNOSIS — J0110 Acute frontal sinusitis, unspecified: Secondary | ICD-10-CM

## 2018-07-08 MED ORDER — AZITHROMYCIN 250 MG PO TABS: 250.0000 mg | ORAL_TABLET | Freq: Every day | ORAL | 0 refills | Status: DC

## 2018-07-08 NOTE — ED Triage Notes (Signed)
Pt complains of nasal congestion and cough since last Friday.  Pt has been taking Theraflu, her last dose was yesterday.

## 2018-07-08 NOTE — ED Provider Notes (Signed)
[facility]    CSN: [id number] Arrival date & time: 07/08/18  1823     History   Chief Complaint Chief Complaint  Patient presents with  . URI    HPI Deanna Howe is a 48 y.o. female.   She is presenting with sinus congestion, cough, and ear fullness.  Her symptoms started last Friday.  They have continued and gotten worse.  Has not had any improvement with home remedies.  She works as a Engineer, civil (consulting).  Denies any reported fevers.  HPI  Past Medical History:  Diagnosis Date  . Anxiety   . Depression   . Hypertension   . Peptic ulcer due to Helicobacter pylori     There are no active problems to display for this patient.   Past Surgical History:  Procedure Laterality Date  . ABDOMINAL HYSTERECTOMY    . APPENDECTOMY    . CESAREAN SECTION    . CHOLECYSTECTOMY    . INCISION AND DRAINAGE Left 09/06/2012   Procedure: INCISION AND DRAINAGE;  Surgeon: Zelphia Cairo, MD;  Location: [facility];  Service: Gynecology;  Laterality: Left;  of ovarian cyst  . LAPAROSCOPY N/A 09/06/2012   Procedure: LAPAROSCOPY OPERATIVE;  Surgeon: Zelphia Cairo, MD;  Location: [facility];  Service: Gynecology;  Laterality: N/A;  . OOPHORECTOMY     RT side    OB History   No obstetric history on file.      Home Medications    Prior to Admission medications   Medication Sig Start Date End Date Taking? Authorizing Provider  hydrochlorothiazide (HYDRODIURIL) 25 MG tablet Take 25 mg by mouth daily.   Yes [provider]  venlafaxine XR (EFFEXOR-XR) 75 MG 24 hr capsule Take 75 mg by mouth daily with breakfast.   Yes [provider]  azithromycin (ZITHROMAX) 250 MG tablet Take 1 tablet (250 mg total) by mouth daily. Take first 2 tablets together, then 1 every day until finished. 07/08/18   Myra Rude, MD  bimatoprost (LUMIGAN) 0.03 % ophthalmic solution Place 1 drop into both eyes at bedtime.    [provider]  EPINEPHrine 0.3 mg/0.3 mL IJ SOAJ injection Inject  0.3 mg into the muscle once.  02/21/13   [provider]  Multiple Vitamins-Minerals (MULTIVITAMIN WITH MINERALS) tablet Take 1 tablet by mouth daily.    [provider]  ondansetron (ZOFRAN ODT) 4 MG disintegrating tablet Take 1 tablet (4 mg total) by mouth every 8 (eight) hours as needed for nausea. Patient not taking: Reported on 06/18/2015 03/07/15   Garlon Hatchet, PA-C  traMADol (ULTRAM) 50 MG tablet Take 1 tablet (50 mg total) by mouth 2 (two) times daily. Patient not taking: Reported on 03/07/2015 05/17/14   Vivi Barrack, DPM    Family History History reviewed. No pertinent family history.  Social History Social History   Tobacco Use  . Smoking status: Never Smoker  . Smokeless tobacco: Never Used  Substance Use Topics  . Alcohol use: No  . Drug use: No     Allergies   Codeine; Dilaudid [hydromorphone hcl]; Hydrocodone; Latex; Penicillins; Adhesive [tape]; and Morphine and related   Review of Systems Review of Systems  Constitutional: Negative for fever.  HENT: Positive for congestion and sinus pressure.   Respiratory: Positive for cough.   Cardiovascular: Negative for chest pain.  Gastrointestinal: Negative for abdominal pain.  Musculoskeletal: Negative for arthralgias.  Skin: Negative for color change.     Physical Exam Triage Vital Signs ED Triage  Vitals [07/08/18 1915]  Enc Vitals Group     BP (!) 143/95     Pulse Rate 70     Resp 18     Temp (!) 97.2 F (36.2 C)     Temp Source Oral     SpO2 100 %     Weight      Height      Head Circumference      Peak Flow      Pain Score 0     Pain Loc      Pain Edu?      Excl. in GC?    No data found.  Updated Vital Signs BP (!) 143/95 (BP Location: Right Arm)   Pulse 70   Temp (!) 97.2 F (36.2 C) (Oral)   Resp 18   SpO2 100%   Visual Acuity Right Eye Distance:   Left Eye Distance:   Bilateral Distance:    Right Eye Near:   Left Eye Near:    Bilateral Near:      Physical Exam Gen: NAD, alert, cooperative with exam,  ENT: normal lips, normal nasal mucosa, tympanic membranes clear and intact bilaterally, normal oropharynx, no cervical lymphadenopathy Eye: normal EOM, normal conjunctiva and lids CV:  no edema, +2 pedal pulses, regular rate and rhythm, S1-S2   Resp: no accessory muscle use, non-labored, clear to auscultation bilaterally, no crackles or wheezes Skin: no rashes, no areas of induration  Neuro: normal tone, normal sensation to touch Psych:  normal insight, alert and oriented MSK: Normal gait, normal strength    UC Treatments / Results  Labs (all labs ordered are listed, but only abnormal results are displayed) Labs Reviewed - No data to display  EKG None  Radiology No results found.  Procedures Procedures (including critical care time)  Medications Ordered in UC Medications - No data to display  Initial Impression / Assessment and Plan / UC Course  I have reviewed the triage vital signs and the nursing notes.  Pertinent labs & imaging results that were available during my care of the patient were reviewed by me and considered in my medical decision making (see chart for details).    [name] is a 47 year old female is presenting with a upper respiratory infection.  Possible for sinusitis.  Will provide azithromycin to have on hand if she has failure to improve.  Counseled on supportive care.  Given indications to follow-up and return.   Final Clinical Impressions(s) / UC Diagnoses   Final diagnoses:  Acute non-recurrent frontal sinusitis     Discharge Instructions     Please try things such as zyrtec-D or allegra-D which is an antihistamine and decongestant.  Please try afrin which will help with nasal congestion but use for only three days.  Please try honey, vick's vapor rub, lozenges and humidifer for cough and sore throat  Please follow up with [place] if your symptoms fail to improve.     ED Prescriptions     Medication Sig Dispense Auth. Provider   azithromycin (ZITHROMAX) 250 MG tablet Take 1 tablet (250 mg total) by mouth daily. Take first 2 tablets together, then 1 every day until finished. 6 tablet Myra Rude, MD     Controlled Substance Prescriptions [place] Controlled Substance Registry consulted? Not Applicable   Myra Rude, MD 07/08/18 2041

## 2018-07-08 NOTE — Discharge Instructions (Addendum)
Please try things such as zyrtec-D or allegra-D which is an antihistamine and decongestant.  Please try afrin which will help with nasal congestion but use for only three days.  Please try honey, vick's vapor rub, lozenges and humidifer for cough and sore throat  Please follow up with [place] if your symptoms fail to improve.

## 2019-01-26 DIAGNOSIS — E7800 Pure hypercholesterolemia, unspecified: Secondary | ICD-10-CM | POA: Insufficient documentation

## 2019-05-04 ENCOUNTER — Other Ambulatory Visit: Payer: Self-pay

## 2019-05-04 ENCOUNTER — Ambulatory Visit
Admission: EM | Admit: 2019-05-04 | Discharge: 2019-05-04 | Disposition: A | Discharge status: Home / Self Care | Payer: 59 | Attending: Emergency Medicine | Admitting: Emergency Medicine

## 2019-05-04 ENCOUNTER — Encounter: Payer: Self-pay | Admitting: Emergency Medicine

## 2019-05-04 DIAGNOSIS — R197 Diarrhea, unspecified: Secondary | ICD-10-CM | POA: Diagnosis not present

## 2019-05-04 DIAGNOSIS — Z20822 Contact with and (suspected) exposure to covid-19: Secondary | ICD-10-CM

## 2019-05-04 NOTE — ED Provider Notes (Signed)
[facility]    CSN: [id number] Arrival date & time: 05/04/19  0102      History   Chief Complaint Chief Complaint  Patient presents with  . Flu-Like Symptoms    HPI Deanna Howe is a 49 y.o. 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  Diarrhea is loose, not painful.  Having 7 BM yesterday, none today.  Able to keep down food/water.  Has not taken anything for symptoms.  Work requesting evaluation/testing.  Past Medical History:  Diagnosis Date  . Anxiety   . Depression   . Hypertension   . Peptic ulcer due to Helicobacter pylori     There are no problems to display for this patient.   Past Surgical History:  Procedure Laterality Date  . ABDOMINAL HYSTERECTOMY    . APPENDECTOMY    . CESAREAN SECTION    . CHOLECYSTECTOMY    . INCISION AND DRAINAGE Left 09/06/2012   Procedure: INCISION AND DRAINAGE;  Surgeon: Marylynn Pearson, MD;  Location: [facility];  Service: Gynecology;  Laterality: Left;  of ovarian cyst  . LAPAROSCOPY N/A 09/06/2012   Procedure: LAPAROSCOPY OPERATIVE;  Surgeon: Marylynn Pearson, MD;  Location: [facility];  Service: Gynecology;  Laterality: N/A;  . OOPHORECTOMY     RT side    OB History   No obstetric history on file.      Home Medications    Prior to Admission medications   Medication Sig Start Date End Date Taking? Authorizing Provider  bimatoprost (LUMIGAN) 0.03 % ophthalmic solution Place 1 drop into both eyes at bedtime.    [provider]  EPINEPHrine 0.3 mg/0.3 mL IJ SOAJ injection Inject 0.3 mg into the muscle once.  02/21/13   [provider]  hydrochlorothiazide (HYDRODIURIL) 25 MG tablet Take 25 mg by mouth daily.    [provider]  Multiple Vitamins-Minerals (MULTIVITAMIN WITH MINERALS) tablet Take 1 tablet by mouth  daily.    [provider]  venlafaxine XR (EFFEXOR-XR) 75 MG 24 hr capsule Take 75 mg by mouth daily with breakfast.    [provider]    Family History Family History  Problem Relation Age of Onset  . Hypertension Mother   . Diabetes Mother     Social History Social History   Tobacco Use  . Smoking status: Never Smoker  . Smokeless tobacco: Never Used  Substance Use Topics  . Alcohol use: No  . Drug use: No     Allergies   Codeine, Dilaudid [hydromorphone hcl], Hydrocodone, Latex, Penicillins, Adhesive [tape], and Morphine and related   Review of Systems Review of Systems  Constitutional: Positive for fatigue. Negative for fever.  HENT: Negative for congestion, dental problem, ear pain, facial swelling, hearing loss, sinus pain, sore throat, trouble swallowing and voice change.   Eyes: Negative for photophobia, pain and visual disturbance.  Respiratory: Negative for cough and shortness of breath.   Cardiovascular: Negative for chest pain and palpitations.  Gastrointestinal: Positive for abdominal pain and diarrhea. Negative for blood in stool, nausea and vomiting.       Generalized, better after BM  Musculoskeletal: Positive for myalgias. Negative for arthralgias, gait problem and neck pain.  Neurological: Positive for headaches. Negative for dizziness, weakness and light-headedness.     Physical Exam Triage Vital Signs ED Triage Vitals  Enc  Vitals Group     BP      Pulse      Resp      Temp      Temp src      SpO2      Weight      Height      Head Circumference      Peak Flow      Pain Score      Pain Loc      Pain Edu?      Excl. in GC?    No data found.  Updated Vital Signs BP 134/88 (BP Location: Left Arm)   Pulse 77   Temp 97.6 F (36.4 C) (Temporal)   Resp 16   SpO2 98%   Visual Acuity Right Eye Distance:   Left Eye Distance:   Bilateral Distance:    Right Eye Near:   Left Eye Near:    Bilateral Near:      Physical Exam Constitutional:      General: She is not in acute distress.    Appearance: She is obese. She is not toxic-appearing.  HENT:     Head: Normocephalic and atraumatic.     Mouth/Throat:     Mouth: Mucous membranes are moist.     Pharynx: Oropharynx is clear.  Eyes:     General: No scleral icterus.    Pupils: Pupils are equal, round, and reactive to light.  Cardiovascular:     Rate and Rhythm: Normal rate and regular rhythm.  Pulmonary:     Effort: Pulmonary effort is normal. No respiratory distress.     Breath sounds: No wheezing.  Abdominal:     General: Bowel sounds are normal.     Tenderness: There is no abdominal tenderness.  Skin:    Capillary Refill: Capillary refill takes less than 2 seconds.     Coloration: Skin is not jaundiced or pale.  Neurological:     Mental Status: She is alert and oriented to person, place, and time.      UC Treatments / Results  Labs (all labs ordered are listed, but only abnormal results are displayed) Labs Reviewed  NOVEL CORONAVIRUS, NAA    EKG   Radiology No results found.  Procedures Procedures (including critical care time)  Medications Ordered in UC Medications - No data to display  Initial Impression / Assessment and Plan / UC Course  I have reviewed the triage vital signs and the nursing notes.  Pertinent labs & imaging results that were available during my care of the patient were reviewed by me and considered in my medical decision making (see chart for details).    Patient afebrile, nontoxic, with SpO2 98%.  Covid PCR pending.  Patient to quarantine until results are back.  We will continue supportive management.  Return precautions discussed, patient verbalized understanding and is agreeable to plan. Final Clinical Impressions(s) / UC Diagnoses   Final diagnoses:  Diarrhea, unspecified type  Exposure to COVID-19 virus     Discharge Instructions     Your COVID test is pending - it is important  to quarantine / isolate at home until your results are back. If you test positive and would like further evaluation for persistent or worsening symptoms, you may schedule an E-visit or virtual (video) visit throughout the [facility] app or website.  PLEASE NOTE: If you develop severe chest pain or shortness of breath please go to the ER or call 9-1-1 for further evaluation --> DO  NOT schedule electronic or virtual visits for this. Please call our office for further guidance / recommendations as needed.    ED Prescriptions    None     PDMP not reviewed this encounter.   Odette Fraction [place], [place] 05/04/19 7062

## 2019-05-04 NOTE — Discharge Instructions (Signed)
Your COVID test is pending - it is important to quarantine / isolate at home until your results are back. °If you test positive and would like further evaluation for persistent or worsening symptoms, you may schedule an E-visit or virtual (video) visit throughout the [facility] MyChart app or website. ° °PLEASE NOTE: If you develop severe chest pain or shortness of breath please go to the ER or call 9-1-1 for further evaluation --> DO NOT schedule electronic or virtual visits for this. °Please call our office for further guidance / recommendations as needed. °

## 2019-05-04 NOTE — ED Triage Notes (Signed)
Pt presents to [facility] for assessment of fatigue, malaise, headache, diarrhea x 2 days.  COVID test done Monday at work, won't  Be back until Friday.  Wanted Rapid, but agreed to PCR for possible quicker turn around than work.  6 episodes of diarrhea in the last 24 hours.  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

## 2019-05-06 LAB — NOVEL CORONAVIRUS, NAA: SARS-CoV-2, NAA: NOT DETECTED

## 2019-07-28 ENCOUNTER — Ambulatory Visit: Payer: Self-pay | Attending: Internal Medicine

## 2019-07-28 DIAGNOSIS — Z23 Encounter for immunization: Secondary | ICD-10-CM

## 2019-07-28 NOTE — Progress Notes (Signed)
   Covid-19 Vaccination Clinic  Name:  Deanna Howe    MRN: [id number] DOB: [birth year]  07/28/2019  Deanna Howe was observed post Covid-19 immunization for 15 minutes without incident. She was provided with Vaccine Information Sheet and instruction to access the V-Safe system.   Deanna Howe was instructed to call 911 with any severe reactions post vaccine: Marland Kitchen Difficulty breathing  . Swelling of face and throat  . A fast heartbeat  . A bad rash all over body  . Dizziness and weakness   Immunizations Administered    Name Date Dose VIS Date Route   Pfizer COVID-19 Vaccine 07/28/2019  3:27 PM 0.3 mL 04/08/2019 Intramuscular   Manufacturer: [employer], [employer]   Lot: [id number]   NDC: [address]-9

## 2019-08-22 ENCOUNTER — Ambulatory Visit: Payer: Self-pay | Attending: Internal Medicine

## 2019-08-22 DIAGNOSIS — Z23 Encounter for immunization: Secondary | ICD-10-CM

## 2019-08-22 NOTE — Progress Notes (Signed)
   Covid-19 Vaccination Clinic  Name:  Deanna Howe    MRN: [id number] DOB: 1971-01-13  08/22/2019  [name] was observed post Covid-19 immunization for 15 minutes without incident. She was provided with Vaccine Information Sheet and instruction to access the V-Safe system.   [name] was instructed to call 911 with any severe reactions post vaccine: Marland Kitchen Difficulty breathing  . Swelling of face and throat  . A fast heartbeat  . A bad rash all over body  . Dizziness and weakness   Immunizations Administered    Name Date Dose VIS Date Route   Pfizer COVID-19 Vaccine 08/22/2019 10:29 AM 0.3 mL 06/22/2018 Intramuscular   Manufacturer: [employer], [employer]   Lot: [id number]   NDC: [address]-4

## 2019-12-20 DIAGNOSIS — E559 Vitamin D deficiency, unspecified: Secondary | ICD-10-CM | POA: Insufficient documentation

## 2020-01-30 ENCOUNTER — Other Ambulatory Visit: Payer: Self-pay

## 2020-02-27 ENCOUNTER — Ambulatory Visit
Admission: EM | Admit: 2020-02-27 | Discharge: 2020-02-27 | Disposition: A | Discharge status: Home / Self Care | Payer: Self-pay | Attending: Emergency Medicine | Admitting: Emergency Medicine

## 2020-02-27 ENCOUNTER — Other Ambulatory Visit: Payer: Self-pay

## 2020-02-27 DIAGNOSIS — J069 Acute upper respiratory infection, unspecified: Secondary | ICD-10-CM

## 2020-02-27 MED ORDER — CETIRIZINE HCL 10 MG PO TABS: 10.0000 mg | ORAL_TABLET | Freq: Every day | ORAL | 0 refills | Status: DC

## 2020-02-27 MED ORDER — BENZONATATE 100 MG PO CAPS: 100.0000 mg | ORAL_CAPSULE | Freq: Three times a day (TID) | ORAL | 0 refills | Status: DC

## 2020-02-27 MED ORDER — FLUTICASONE PROPIONATE 50 MCG/ACT NA SUSP: 1.0000 | Freq: Every day | NASAL | 0 refills | Status: DC

## 2020-02-27 NOTE — Discharge Instructions (Addendum)

## 2020-02-27 NOTE — ED Triage Notes (Signed)
Pt states she started developing a cough, congestion, headache, and generalized body aches on Saturday. Pt has also had an intermittent fever since Saturday. Pt states symptoms are worsening and otc meds are giving her no relief. Pt is aox4 and ambulatory.

## 2020-02-27 NOTE — ED Provider Notes (Signed)
[facility]    CSN: [id number] Arrival date & time: 02/27/20  1641      History   Chief Complaint Chief Complaint  Patient presents with  . Cough    since saturday  . Headache    since saturday  . Generalized Body Aches    since saturday    HPI Deanna Howe is a 49 y.o. female   Deanna Howe is a 49 y.o. female here for evaluation of a cough.  The cough is non-productive, without wheezing, dyspnea or hemoptysis and is aggravated by nothing. Onset of symptoms was 3 days ago, unchanged since that time.  Associated symptoms include fever and postnasal drip. Patient does not have a history of asthma. Patient has not had recent travel. Patient does not have a history of smoking. Patient  has not had a previous chest x-ray. Patient has not had a PPD done.  Is covid vaccinated. The following portions of the patient's history were reviewed and updated as appropriate: allergies, current medications, past family history, past medical history, past social history, past surgical history and problem list.     Past Medical History:  Diagnosis Date  . Anxiety   . Depression   . Hypertension   . Peptic ulcer due to Helicobacter pylori     There are no problems to display for this patient.   Past Surgical History:  Procedure Laterality Date  . ABDOMINAL HYSTERECTOMY    . APPENDECTOMY    . CESAREAN SECTION    . CHOLECYSTECTOMY    . INCISION AND DRAINAGE Left 09/06/2012   Procedure: INCISION AND DRAINAGE;  Surgeon: Zelphia Cairo, MD;  Location: [facility];  Service: Gynecology;  Laterality: Left;  of ovarian cyst  . LAPAROSCOPY N/A 09/06/2012   Procedure: LAPAROSCOPY OPERATIVE;  Surgeon: Zelphia Cairo, MD;  Location: [facility];  Service: Gynecology;  Laterality: N/A;  . OOPHORECTOMY     RT side    OB History   No obstetric history on file.      Home Medications    Prior to Admission medications   Medication Sig Start Date End Date Taking? Authorizing Provider   hydrochlorothiazide (HYDRODIURIL) 25 MG tablet Take 25 mg by mouth daily.   Yes [provider]  Multiple Vitamins-Minerals (MULTIVITAMIN WITH MINERALS) tablet Take 1 tablet by mouth daily.   Yes [provider]  venlafaxine XR (EFFEXOR-XR) 75 MG 24 hr capsule Take 75 mg by mouth daily with breakfast.   Yes [provider]  benzonatate (TESSALON) 100 MG capsule Take 1 capsule (100 mg total) by mouth every 8 (eight) hours. 02/27/20   Hall-Potvin, [place], PA-C  bimatoprost (LUMIGAN) 0.03 % ophthalmic solution Place 1 drop into both eyes at bedtime.    [provider]  cetirizine (ZYRTEC ALLERGY) 10 MG tablet Take 1 tablet (10 mg total) by mouth daily. 02/27/20   Hall-Potvin, [place], PA-C  EPINEPHrine 0.3 mg/0.3 mL IJ SOAJ injection Inject 0.3 mg into the muscle once.  02/21/13   [provider]  fluticasone (FLONASE) 50 MCG/ACT nasal spray Place 1 spray into both nostrils daily. 02/27/20   Hall-Potvin, [place], PA-C    Family History Family History  Problem Relation Age of Onset  . Hypertension Mother   . Diabetes Mother     Social History Social History   Tobacco Use  . Smoking status: Never Smoker  . Smokeless tobacco: Never Used  Vaping Use  . Vaping Use: Never used  Substance Use Topics  .  Alcohol use: No  . Drug use: No     Allergies   Codeine, Dilaudid [hydromorphone hcl], Hydrocodone, Latex, Penicillins, Adhesive [tape], and Morphine and related   Review of Systems Review of Systems  Constitutional: Positive for activity change, appetite change, chills and fever. Negative for fatigue.  HENT: Positive for congestion, postnasal drip and sore throat. Negative for ear pain, sinus pain and voice change.   Eyes: Negative for pain, redness and visual disturbance.  Respiratory: Positive for cough. Negative for shortness of breath and wheezing.   Cardiovascular: Negative for chest pain and palpitations.  Gastrointestinal: Negative  for abdominal pain, diarrhea and vomiting.  Musculoskeletal: Positive for myalgias. Negative for arthralgias, neck pain and neck stiffness.  Skin: Negative for rash and wound.  Neurological: Negative for syncope and headaches.     Physical Exam Triage Vital Signs ED Triage Vitals [02/27/20 1728]  Enc Vitals Group     BP (!) 139/94     Pulse Rate (!) 109     Resp 20     Temp (!) 101.6 F (38.7 C)     Temp Source Oral     SpO2 95 %     Weight      Height      Head Circumference      Peak Flow      Pain Score      Pain Loc      Pain Edu?      Excl. in GC?    No data found.  Updated Vital Signs BP (!) 139/94 (BP Location: Left Arm)   Pulse (!) 109   Temp (!) 101.6 F (38.7 C) (Oral)   Resp 20   SpO2 95%   Visual Acuity Right Eye Distance:   Left Eye Distance:   Bilateral Distance:    Right Eye Near:   Left Eye Near:    Bilateral Near:     Physical Exam Constitutional:      General: She is not in acute distress.    Appearance: She is well-developed. She is obese. She is ill-appearing. She is not toxic-appearing or diaphoretic.  HENT:     Head: Normocephalic and atraumatic.     Mouth/Throat:     Mouth: Mucous membranes are moist.     Pharynx: Oropharynx is clear. No oropharyngeal exudate or posterior oropharyngeal erythema.  Eyes:     General: No scleral icterus.    Conjunctiva/sclera: Conjunctivae normal.     Pupils: Pupils are equal, round, and reactive to light.  Neck:     Comments: Trachea midline, negative JVD Cardiovascular:     Rate and Rhythm: Normal rate and regular rhythm.     Heart sounds: No murmur heard.  No gallop.      Comments: HR 95 at bedside Pulmonary:     Effort: Pulmonary effort is normal. No respiratory distress.     Breath sounds: No wheezing, rhonchi or rales.  Musculoskeletal:     Cervical back: Neck supple. No tenderness.  Lymphadenopathy:     Cervical: No cervical adenopathy.  Skin:    General: Skin is warm.     Capillary  Refill: Capillary refill takes less than 2 seconds.     Coloration: Skin is not cyanotic, jaundiced or pale.     Findings: No rash.  Neurological:     General: No focal deficit present.     Mental Status: She is alert and oriented to person, place, and time.      UC Treatments /  Results  Labs (all labs ordered are listed, but only abnormal results are displayed) Labs Reviewed  NOVEL CORONAVIRUS, NAA    EKG   Radiology No results found.  Procedures Procedures (including critical care time)  Medications Ordered in UC Medications - No data to display  Initial Impression / Assessment and Plan / UC Course  I have reviewed the triage vital signs and the nursing notes.  Pertinent labs & imaging results that were available during my care of the patient were reviewed by me and considered in my medical decision making (see chart for details).     Patient febrile, nontoxic, with SpO2 95%.  Covid PCR pending.  Patient to quarantine until results are back.  We will treat supportively as outlined below.  Return precautions discussed, patient verbalized understanding and is agreeable to plan. Final Clinical Impressions(s) / UC Diagnoses   Final diagnoses:  URI with cough and congestion     Discharge Instructions     Tessalon for cough. Start flonase, atrovent nasal spray for nasal congestion/drainage. You can use over the counter nasal saline rinse such as neti pot for nasal congestion. Keep hydrated, your urine should be clear to pale yellow in color. Tylenol/motrin for fever and pain. Monitor for any worsening of symptoms, chest pain, shortness of breath, wheezing, swelling of the throat, go to the emergency department for further evaluation needed.     ED Prescriptions    Medication Sig Dispense Auth. Provider   benzonatate (TESSALON) 100 MG capsule Take 1 capsule (100 mg total) by mouth every 8 (eight) hours. 21 capsule Hall-Potvin, [place], PA-C   fluticasone (FLONASE) 50  MCG/ACT nasal spray Place 1 spray into both nostrils daily. 16 g Hall-Potvin, [place], PA-C   cetirizine (ZYRTEC ALLERGY) 10 MG tablet Take 1 tablet (10 mg total) by mouth daily. 30 tablet Hall-Potvin, [place], PA-C     PDMP not reviewed this encounter.   Hall-Potvin, [place], [place] 02/27/20 1826

## 2020-02-29 LAB — NOVEL CORONAVIRUS, NAA: SARS-CoV-2, NAA: DETECTED — AB

## 2020-02-29 LAB — SARS-COV-2, NAA 2 DAY TAT

## 2020-03-01 ENCOUNTER — Other Ambulatory Visit: Payer: Self-pay | Admitting: Nurse Practitioner

## 2020-03-01 ENCOUNTER — Ambulatory Visit (HOSPITAL_COMMUNITY)
Admission: RE | Admit: 2020-03-01 | Discharge: 2020-03-01 | Disposition: A | Discharge status: Home / Self Care | Payer: HRSA Program | Source: Ambulatory Visit | Attending: Pulmonary Disease | Admitting: Pulmonary Disease

## 2020-03-01 DIAGNOSIS — U071 COVID-19: Secondary | ICD-10-CM

## 2020-03-01 DIAGNOSIS — I10 Essential (primary) hypertension: Secondary | ICD-10-CM | POA: Insufficient documentation

## 2020-03-01 MED ORDER — SODIUM CHLORIDE 0.9 % IV SOLN: INTRAVENOUS | Status: DC | PRN

## 2020-03-01 MED ORDER — ALBUTEROL SULFATE HFA 108 (90 BASE) MCG/ACT IN AERS: 2.0000 | INHALATION_SPRAY | Freq: Once | RESPIRATORY_TRACT | Status: DC | PRN

## 2020-03-01 MED ORDER — DIPHENHYDRAMINE HCL 50 MG/ML IJ SOLN: 50.0000 mg | Freq: Once | INTRAMUSCULAR | Status: DC | PRN

## 2020-03-01 MED ORDER — SOTROVIMAB 500 MG/8ML IV SOLN
500.0000 mg | Freq: Once | INTRAVENOUS | Status: AC
  Administered 2020-03-01: 500 mg via INTRAVENOUS

## 2020-03-01 MED ORDER — FAMOTIDINE IN NACL 20-0.9 MG/50ML-% IV SOLN: 20.0000 mg | Freq: Once | INTRAVENOUS | Status: DC | PRN

## 2020-03-01 MED ORDER — METHYLPREDNISOLONE SODIUM SUCC 125 MG IJ SOLR: 125.0000 mg | Freq: Once | INTRAMUSCULAR | Status: DC | PRN

## 2020-03-01 MED ORDER — EPINEPHRINE 0.3 MG/0.3ML IJ SOAJ: 0.3000 mg | Freq: Once | INTRAMUSCULAR | Status: DC | PRN

## 2020-03-01 NOTE — Progress Notes (Signed)
I connected by phone with Deanna Howe on 03/01/2020 at 10:19 AM to discuss the potential use of a new treatment for mild to moderate COVID-19 viral infection in non-hospitalized patients.  This patient is a 49 y.o. female that meets the FDA criteria for Emergency Use Authorization of COVID monoclonal antibody casirivimab/imdevimab, bamlanivimab/eteseviamb, or sotrovimab.  Has a (+) direct SARS-CoV-2 viral test result  Has mild or moderate COVID-19   Is NOT hospitalized due to COVID-19  Is within 10 days of symptom onset  Has at least one of the high risk factor(s) for progression to severe COVID-19 and/or hospitalization as defined in EUA.  Specific high risk criteria : BMI > 25 and Cardiovascular disease or hypertension   I have spoken and communicated the following to the patient or parent/caregiver regarding COVID monoclonal antibody treatment:  1. FDA has authorized the emergency use for the treatment of mild to moderate COVID-19 in adults and pediatric patients with positive results of direct SARS-CoV-2 viral testing who are 47 years of age and older weighing at least 40 kg, and who are at high risk for progressing to severe COVID-19 and/or hospitalization.  2. The significant known and potential risks and benefits of COVID monoclonal antibody, and the extent to which such potential risks and benefits are unknown.  3. Information on available alternative treatments and the risks and benefits of those alternatives, including clinical trials.  4. Patients treated with COVID monoclonal antibody should continue to self-isolate and use infection control measures (e.g., wear mask, isolate, social distance, avoid sharing personal items, clean and disinfect "high touch" surfaces, and frequent handwashing) according to CDC guidelines.   5. The patient or parent/caregiver has the option to accept or refuse COVID monoclonal antibody treatment.  After reviewing this information with the  patient, the patient has agreed to receive one of the available covid 19 monoclonal antibodies and will be provided an appropriate fact sheet prior to infusion. [name], NP 03/01/2020 10:19 AM

## 2020-03-01 NOTE — Discharge Instructions (Signed)

## 2020-03-01 NOTE — Progress Notes (Signed)
  Diagnosis: COVID-19  Physician: Dr. Delford Field   Procedure: [facility] Med: sotrovimab infusion - Provided patient with sotrovimab fact sheet for patients, parents and caregivers prior to infusion.  Complications: No immediate complications noted.  Discharge: Discharged home   Deanna Howe 03/01/2020

## 2020-09-20 ENCOUNTER — Ambulatory Visit: Payer: Self-pay | Admitting: Adult Health

## 2020-09-28 ENCOUNTER — Ambulatory Visit (INDEPENDENT_AMBULATORY_CARE_PROVIDER_SITE_OTHER): Payer: No Typology Code available for payment source | Admitting: Nurse Practitioner

## 2020-09-28 ENCOUNTER — Telehealth: Payer: Self-pay

## 2020-09-28 ENCOUNTER — Other Ambulatory Visit: Payer: Self-pay

## 2020-09-28 ENCOUNTER — Encounter: Payer: Self-pay | Admitting: Nurse Practitioner

## 2020-09-28 VITALS — BP 138/74 | HR 81 | Temp 98.1°F | Resp 16 | Ht 69.0 in | Wt 275.2 lb

## 2020-09-28 DIAGNOSIS — Z1212 Encounter for screening for malignant neoplasm of rectum: Secondary | ICD-10-CM

## 2020-09-28 DIAGNOSIS — R7303 Prediabetes: Secondary | ICD-10-CM

## 2020-09-28 DIAGNOSIS — Z7689 Persons encountering health services in other specified circumstances: Secondary | ICD-10-CM | POA: Diagnosis not present

## 2020-09-28 DIAGNOSIS — F321 Major depressive disorder, single episode, moderate: Secondary | ICD-10-CM

## 2020-09-28 DIAGNOSIS — E538 Deficiency of other specified B group vitamins: Secondary | ICD-10-CM

## 2020-09-28 DIAGNOSIS — G4733 Obstructive sleep apnea (adult) (pediatric): Secondary | ICD-10-CM | POA: Diagnosis not present

## 2020-09-28 DIAGNOSIS — R5383 Other fatigue: Secondary | ICD-10-CM

## 2020-09-28 DIAGNOSIS — Z1211 Encounter for screening for malignant neoplasm of colon: Secondary | ICD-10-CM

## 2020-09-28 DIAGNOSIS — Z1231 Encounter for screening mammogram for malignant neoplasm of breast: Secondary | ICD-10-CM

## 2020-09-28 MED ORDER — BUPROPION HCL ER (XL) 300 MG PO TB24: 300.0000 mg | ORAL_TABLET | Freq: Every day | ORAL | 2 refills | Status: DC

## 2020-09-28 NOTE — Progress Notes (Signed)
[facility] [place], [place] [address]  Internal MEDICINE  Office Visit Note  Patient Name: Deanna Howe  [id number]  [id number]  Date of Service: 09/30/2020   Complaints/HPI Pt is here for establishment of PCP. Chief Complaint  Patient presents with  . Establish Care    Stomach pain, weight management    . Quality Metric Gaps    Colonoscopy, mammogram   . Sleep Apnea   HPI Deanna Howe presents for a new patient visit to establish care. She lives at home with 37 yo son which she states contributes to his depression because he does not want to work or do anything and he won't move out. 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 Reports work and coworkers are contributing to depression because they keep "rubbing it in" that I am an LPN and not an Therapist, sports. This is also a new job for her. She is a nonsmoker, drinks 2 drinks per year maybe, denies any illicit drug use.  Her past medical history significant for depression and hypertension She is working on diet modifications and is going to the gym regularly now.  -reports stomach pain when she eats certain things, brocolli, bloating, gassy.    Current Medication: Outpatient Encounter Medications as of 09/28/2020  Medication Sig Note  . buPROPion (WELLBUTRIN XL) 300 MG 24 hr tablet Take 1 tablet (300 mg total) by mouth daily.   . ergocalciferol (VITAMIN D2) 1.25 MG (50000 UT) capsule Take by mouth.   . [DISCONTINUED] buPROPion (WELLBUTRIN XL) 150 MG 24 hr tablet Take by mouth.   . hydrochlorothiazide (HYDRODIURIL) 25 MG tablet Take 50 mg by mouth daily.   . Multiple Vitamins-Minerals (MULTIVITAMIN WITH MINERALS) tablet Take 1 tablet by mouth daily.   Marland Kitchen venlafaxine XR (EFFEXOR-XR) 75 MG 24 hr capsule Take 150 mg by mouth daily with breakfast.   . [DISCONTINUED] benzonatate (TESSALON) 100 MG capsule Take 1 capsule (100 mg total) by mouth every 8 (eight) hours.   . [DISCONTINUED] bimatoprost (LUMIGAN) 0.03 % ophthalmic  solution Place 1 drop into both eyes at bedtime. 03/07/2015: Received from: [place]: Place 1 drop into both eyes at bedtime.  . [DISCONTINUED] cetirizine (ZYRTEC ALLERGY) 10 MG tablet Take 1 tablet (10 mg total) by mouth daily.   . [DISCONTINUED] EPINEPHrine 0.3 mg/0.3 mL IJ SOAJ injection Inject 0.3 mg into the muscle once.  03/07/2015: Received from: [place]: Inject 0.3 mg into the muscle.  . [DISCONTINUED] fluticasone (FLONASE) 50 MCG/ACT nasal spray Place 1 spray into both nostrils daily.    No facility-administered encounter medications on file as of 09/28/2020.    Surgical History: Past Surgical History:  Procedure Laterality Date  . ABDOMINAL HYSTERECTOMY    . APPENDECTOMY    . CESAREAN SECTION    . CHOLECYSTECTOMY    . INCISION AND DRAINAGE Left 09/06/2012   Procedure: INCISION AND DRAINAGE;  Surgeon: Marylynn Pearson, MD;  Location: [facility];  Service: Gynecology;  Laterality: Left;  of ovarian cyst  . LAPAROSCOPY N/A 09/06/2012   Procedure: LAPAROSCOPY OPERATIVE;  Surgeon: Marylynn Pearson, MD;  Location: [facility];  Service: Gynecology;  Laterality: N/A;  . OOPHORECTOMY     RT side    Medical History: Past Medical History:  Diagnosis Date  . Depression   . Hypertension   . Peptic ulcer due to Helicobacter pylori     Family History: Family History  Problem Relation Age of Onset  .  Hypertension Mother   . Diabetes Mother     Social History   Socioeconomic History  . Marital status: Divorced    Spouse name: Not on file  . Number of children: Not on file  . Years of education: Not on file  . Highest education level: Not on file  Occupational History  . Not on file  Tobacco Use  . Smoking status: Never Smoker  . Smokeless tobacco: Never Used  Vaping Use  . Vaping Use: Never used  Substance and Sexual Activity  . Alcohol use: No  . Drug use: No  . Sexual activity: Not Currently  Other Topics Concern  . Not on file  Social  History Narrative  . Not on file   Social Determinants of Health   Financial Resource Strain: Not on file  Food Insecurity: Not on file  Transportation Needs: Not on file  Physical Activity: Not on file  Stress: Not on file  Social Connections: Not on file  Intimate Partner Violence: Not on file     Review of Systems  Constitutional: Negative for chills, fatigue and unexpected weight change.  HENT: Negative for congestion, rhinorrhea, sneezing and sore throat.   Eyes: Negative for redness.  Respiratory: Negative for cough, chest tightness and shortness of breath.   Cardiovascular: Negative for chest pain and palpitations.  Gastrointestinal: Negative for abdominal pain, constipation, diarrhea, nausea and vomiting.  Genitourinary: Negative for dysuria and frequency.  Musculoskeletal: Negative for arthralgias, back pain, joint swelling and neck pain.  Skin: Negative for rash.  Neurological: Negative.  Negative for tremors and numbness.  Hematological: Negative for adenopathy. Does not bruise/bleed easily.  Psychiatric/Behavioral: Negative for behavioral problems (Depression), sleep disturbance and suicidal ideas. The patient is not nervous/anxious.     Vital Signs: BP 138/74   Pulse 81   Temp 98.1 F (36.7 C)   Resp 16   Ht 5' 9"  (1.753 m)   Wt 275 lb 3.2 oz (124.8 kg)   SpO2 99%   BMI 40.64 kg/m    Physical Exam Vitals reviewed.  Constitutional:      General: She is not in acute distress.    Appearance: Normal appearance. She is well-developed and well-groomed. She is morbidly obese. She is not ill-appearing or diaphoretic.  HENT:     Head: Normocephalic and atraumatic.  Neck:     Thyroid: No thyromegaly.     Vascular: No JVD.     Trachea: No tracheal deviation.  Cardiovascular:     Rate and Rhythm: Normal rate and regular rhythm.     Pulses: Normal pulses.     Heart sounds: Normal heart sounds. No murmur heard. No friction rub. No gallop.   Pulmonary:      Effort: Pulmonary effort is normal. No respiratory distress.     Breath sounds: Normal breath sounds. No wheezing or rales.  Chest:     Chest wall: No tenderness.  Abdominal:     General: Bowel sounds are normal.     Palpations: Abdomen is soft.  Skin:    General: Skin is warm and dry.     Capillary Refill: Capillary refill takes less than 2 seconds.  Neurological:     Mental Status: She is alert and oriented to person, place, and time.  Psychiatric:        Mood and Affect: Mood is depressed. Affect is tearful.        Behavior: Behavior normal.        Thought Content: Thought content  normal.        Judgment: Judgment normal.    Assessment/Plan: 1. Encounter to establish care with new doctor Patient establishing care with new PCP - ergocalciferol (VITAMIN D2) 1.25 MG (50000 UT) capsule; Take by mouth.  2. Other fatigue Labs ordered to rule out vitamin deficiency, electrolyte imbalance, anemia, and abnormal hormone levels.  - B12 and Folate Panel - TSH + free T4 - CBC with Differential/Platelet - CMP14+EGFR - Progesterone  3. Prediabetes A1C 5.8, will work on diet modifications and lifestyle modifications., patient requesting no medications yet, would like to work on diet and lifestyle first.  - POCT glycosylated hemoglobin (Hb A1C)  4. Obstructive sleep apnea Patient reports symptoms of sleep apnea, including fatigue in the morning after waking, headache, snoring. Sleep study ordered.  - PSG Sleep Study; Future  5. Screening for colorectal cancer Colorectal cancer screening due via colonoscopy, referral ordered. - Ambulatory referral to Gastroenterology  6. Encounter for screening mammogram for malignant neoplasm of breast Routine breast cancer screening due, mammogram ordered.  - MM Digital Screening; Future  7. Depression, major, single episode, moderate (HCC) History of depression, worsened by stressor currently in home and at work. Bupropion incresaed to 342m  daily. Patient [place]. She will focus on decreasing her stress level. She denies any thoughts of self harm or suicidal thoughts.  - buPROPion (WELLBUTRIN XL) 300 MG 24 hr tablet; Take 1 tablet (300 mg total) by mouth daily.  Dispense: 30 tablet; Refill: 2  8. B12 deficiency Possible vitam deficiency, lab ordered to rule out low B12 or folate.  - B12 and Folate Panel    General Counseling: Jareli verbalizes understanding of the findings of todays visit and agrees with plan of treatment. I have discussed any further diagnostic evaluation that may be needed or ordered today. We also reviewed her medications today. she has been encouraged to call the office with any questions or concerns that should arise related to todays visit.    Counseling:  [place] Controlled Substance Database was reviewed by me.  Orders Placed This Encounter  Procedures  . MM Digital Screening  . B12 and Folate Panel  . TSH + free T4  . CBC with Differential/Platelet  . CMP14+EGFR  . Progesterone  . Ambulatory referral to Gastroenterology  . POCT glycosylated hemoglobin (Hb A1C)  . PSG Sleep Study    Meds ordered this encounter  Medications  . buPROPion (WELLBUTRIN XL) 300 MG 24 hr tablet    Sig: Take 1 tablet (300 mg total) by mouth daily.    Dispense:  30 tablet    Refill:  2    Return in about 1 month (around 10/28/2020) for CPE, Hoby Kawai PCP.  Time spent:30 Minutes  This patient was seen by AJonetta Osgood FNP-C in collaboration with Dr. FClayborn Bignessas a part of collaborative care agreement.    Consandra Laske R. AValetta Fuller MSN, FNP-C Internal Medicine

## 2020-09-28 NOTE — Telephone Encounter (Signed)
Mammogram order faxed over to [facility] in [place] at [phone]. [name]

## 2020-10-05 ENCOUNTER — Encounter: Payer: Self-pay | Admitting: Nurse Practitioner

## 2020-10-09 LAB — POCT GLYCOSYLATED HEMOGLOBIN (HGB A1C): Hemoglobin A1C: 5.8 % — AB (ref 4.0–5.6)

## 2020-10-16 ENCOUNTER — Encounter (INDEPENDENT_AMBULATORY_CARE_PROVIDER_SITE_OTHER): Payer: No Typology Code available for payment source | Admitting: Internal Medicine

## 2020-10-16 DIAGNOSIS — G4719 Other hypersomnia: Secondary | ICD-10-CM | POA: Diagnosis not present

## 2020-10-19 ENCOUNTER — Telehealth: Payer: Self-pay

## 2020-10-19 DIAGNOSIS — G4719 Other hypersomnia: Secondary | ICD-10-CM | POA: Insufficient documentation

## 2020-10-19 NOTE — Procedures (Signed)
[facility]  Polysomnogram Report Part I                                                                 Phone: [phone] Fax: [phone]  Patient Name: Deanna Howe, Deanna Howe Acquisition Number: [id number]  Date of Birth: 04/16/1971 Acquisition Date: 10/16/2020  Referring Physician: Sallyanne Kuster, FNP-C     History: The patient is a 50 year old female who was referred for evaluation of possible sleep apnea. Medical History: depression,  prediabetes.  Medications: bupropion, ergocalciferol.  Procedure: This routine overnight polysomnogram was performed on the [facility] 5 using the standard diagnostic protocol. This included 6 channels of EEG, 2 channels of EOG, chin EMG, bilateral anterior tibialis EMG, nasal/oral thermistor, PTAF (nasal pressure transducer), chest and abdominal wall movements, EKG, and pulse oximetry.  Description: The total recording time was 459.6 minutes. The total sleep time was 319.7 minutes. There were a total of 46.0 minutes of wakefulness after sleep onset for a reducedsleep efficiency of 69.6%. The latency to sleep onset was prolonged at 93.9 minutes. The R sleep onset latency wasprolonged at 257.5 minutes. Sleep parameters, as a percentage of the total sleep time, demonstrated 4.1% of sleep was in N1 sleep, 57.1% N2, 22.1% N3 and 16.7% R sleep. There were a total of 27 arousals for an arousal index of 5.1 arousals per hour of sleep that was normal.  Respiratory monitoring demonstrated occasional mild degree of snoring in all positions. There were 10 apneas and hypopneas for an Apnea Hypopnea Index of 1.9 apneas and hypopneas per hour of sleep. The REM related apnea hypopnea index was 5.6/hr of REM sleep compared to a NREM AHI of 1.1/hr.  The average duration of the respiratory events was 23.6 seconds with a maximum duration of 39.0 seconds. The respiratory events occurred in the supine position. The respiratory events were associated with peripheral oxygen  desaturations on the average to 93%. The lowest oxygen desaturation associated with a respiratory event was 87%. Additionally, the baseline oxygen saturation during wakefulness was 99%, during NREM sleep averaged 96%, and during REM sleep averaged 97%. The total duration of oxygen < 90% was 0.1 minutes.  Cardiac monitoring- There were no significant cardiac rhythm irregularities.   Periodic limb movement monitoring- did not demonstrate periodic limb movements.   Impression: This routine overnight polysomnogram did not demonstrate significant obstructive sleep apnea due to a low Apnea Hypopnea Index of 1.9 apneas and hypopneas per hour.      There was a reduced sleep efficiency due to a prolonged sleep onset latency. Increased awakenings and a reduced percentage of REM sleep were observed.These findings would appear to be due to the combination of increased upper airway resistance and first night effect. The patient reported difficulty sleeping in the laboratory.  Recommendations:     Would recommend weight loss in a patient with a BMI of 40.6.     Yevonne Pax, MD, [facility] Diplomate ABMS-Pulmonary, Critical Care and Sleep Medicine  Electronically reviewed and digitally signed     [facility] Polysomnogram Report Part II  Phone: [phone] Fax: [phone]  Patient last name Howe Neck Size 15.5 in. Acquisition [phone]  Patient first name Deanna Weight 275.0 lbs. Started 10/16/2020 at 9:36:54 PM  Birth date 1970/05/12 Height 69.0 in. Stopped 10/17/2020 at 5:20:48 AM  Age 53 BMI 40.6 lb/in2 Duration 459.6  Study Type Adult      Report generated by: Hampton Abbot, RPSGT Sleep Data: Lights Out: 9:41:00 PM Sleep Onset: 11:14:54 PM  Lights On: 5:20:36 AM Sleep Efficiency: 69.6 %  Total Recording Time: 459.6 min Sleep Latency (from Lights Off) 93.9 min  Total Sleep Time (TST): 319.7 min R Latency (from Sleep Onset): 257.5 min  Sleep Period Time: 365.7 min Total number of  awakenings: 21  Wake during sleep: 46.0 min Wake After Sleep Onset (WASO): 46.0 min   Sleep Data:         Arousal Summary: Stage  Latency from lights out (min) Latency from sleep onset (min) Duration (min) % Total Sleep Time  Normal values  N 1 93.9 0.0 13.0 4.1 (5%)  N 2 96.4 2.5 182.7 57.1 (50%)  N 3 107.4 13.5 70.5 22.1 (20%)  R 351.4 257.5 53.5 16.7 (25%)    Number Index  Spontaneous 31 5.8  Apneas & Hypopneas 0 0.0  RERAs 0 0.0       (Apneas & Hypopneas & RERAs)  (0) (0.0)  Limb Movement 0 0.0  Snore 0 0.0  TOTAL 31 5.8     Respiratory Data:  CA OA MA Apnea Hypopnea* A+ H RERA Total  Number 0 1 0 1 9 10  0 10  Mean Dur (sec) 0.0 10.0 0.0 10.0 25.1 23.6 0.0 23.6  Max Dur (sec) 0.0 10.0 0.0 10.0 39.0 39.0 0.0 39.0  Total Dur (min) 0.0 0.2 0.0 0.2 3.8 3.9 0.0 3.9  % of TST 0.0 0.1 0.0 0.1 1.2 1.2 0.0 1.2  Index (#/h TST) 0.0 0.2 0.0 0.2 1.7 1.9 0.0 1.9  *Hypopneas scored based on 4% or greater desaturation.  Sleep Stage:        REM NREM TST  AHI 5.6 1.1 1.9  RDI 5.6 1.1 1.9           Body Position Data:  Sleep (min) TST (%) REM (min) NREM (min) CA (#) OA (#) MA (#) HYP (#) AHI (#/h) RERA (#) RDI (#/h) Desat (#)  Supine 253.7 79.36 53.5 200.2 0 1 0 9 2.4 0 2.4 23  Non-Supine 66.00 20.64 0.00 66.00 0.00 0.00 0.00 0.00 0.00 0 0.00 0.00  Right: 66.0 20.64 0.0 66.0 0 0 0 0 0.0 0 0.00 0     Snoring: Total number of snoring episodes  0  Total time with snoring    min (   % of sleep)   Oximetry Distribution:             WK REM NREM TOTAL  Average (%)   99 97 96 97  < 90% 0.0 0.0 0.1 0.1  < 80% 0.0 0.0 0.0 0.0  < 70% 0.0 0.0 0.0 0.0  # of Desaturations* 3 6 14 23   Desat Index (#/hour) 1.3 6.7 3.2 4.3  Desat Max (%) 13 5 9 13   Desat Max Dur (sec) 46.0 45.0 67.0 67.0  Approx Min O2 during sleep 87  Approx min O2 during a respiratory event 87  Was Oxygen added (Y/N) and final rate No:   0 LPM  *Desaturations based on 4% or greater drop from  baseline.   Cheyne Stokes Breathing: None Present   Heart Rate Summary:  Average Heart Rate During Sleep 73.7 bpm      Highest Heart Rate During Sleep (95th %) 79.0 bpm  Highest Heart Rate During Sleep 104 bpm      Highest Heart Rate During Recording (TIB) 195 bpm (artifact)   Heart Rate Observations: Event Type # Events   Bradycardia 0 Lowest HR Scored: N/A  Sinus Tachycardia During Sleep 0 Highest HR Scored: N/A  Narrow Complex Tachycardia 0 Highest HR Scored: N/A  Wide Complex Tachycardia 0 Highest HR Scored: N/A  Asystole 0 Longest Pause: N/A  Atrial Fibrillation 0 Duration Longest Event: N/A  Other Arrythmias  No Type:    Periodic Limb Movement Data: (Primary legs unless otherwise noted) Total # Limb Movement 0 Limb Movement Index 0.0  Total # PLMS    PLMS Index     Total # PLMS Arousals    PLMS Arousal Index     Percentage Sleep Time with PLMS   min (   % sleep)  Mean Duration limb movements (secs)

## 2020-10-19 NOTE — Telephone Encounter (Signed)
Patient called stating she is scheduled for colonoscopy at the end of August. She is requesting to be referred elsewhere that can get her in sooner. I sent message to Courtney-Toni

## 2020-10-19 NOTE — Telephone Encounter (Signed)
Patient has been scheduled with [facility] in August. Patient is requesting to be seen sooner. I advised patient most specialists have a 2-3 month wait. I advised patient she is welcome to call around to other local GI specialists and ask their wait for a new patient cc screening and if she finds a place with a  sooner appointment to contact me and I will have the provider place another referral and send it for her. [name]

## 2020-10-30 ENCOUNTER — Telehealth: Payer: Self-pay

## 2020-10-30 NOTE — Telephone Encounter (Signed)
Left vm to screen for 10/31/20 appointment-Toni 

## 2020-10-31 ENCOUNTER — Encounter: Payer: No Typology Code available for payment source | Admitting: Nurse Practitioner

## 2020-11-02 ENCOUNTER — Encounter: Payer: No Typology Code available for payment source | Admitting: Nurse Practitioner

## 2020-12-13 ENCOUNTER — Ambulatory Visit: Payer: No Typology Code available for payment source | Admitting: Gastroenterology

## 2020-12-13 ENCOUNTER — Encounter: Payer: Self-pay | Admitting: *Deleted

## 2021-01-28 ENCOUNTER — Emergency Department
Admission: EM | Admit: 2021-01-28 | Discharge: 2021-01-28 | Disposition: A | Discharge status: Home / Self Care | Payer: BC Managed Care – PPO | Attending: Emergency Medicine | Admitting: Emergency Medicine

## 2021-01-28 ENCOUNTER — Other Ambulatory Visit: Payer: Self-pay

## 2021-01-28 ENCOUNTER — Emergency Department: Payer: BC Managed Care – PPO

## 2021-01-28 DIAGNOSIS — Z79899 Other long term (current) drug therapy: Secondary | ICD-10-CM | POA: Insufficient documentation

## 2021-01-28 DIAGNOSIS — K5900 Constipation, unspecified: Secondary | ICD-10-CM | POA: Diagnosis not present

## 2021-01-28 DIAGNOSIS — R1012 Left upper quadrant pain: Secondary | ICD-10-CM

## 2021-01-28 DIAGNOSIS — I10 Essential (primary) hypertension: Secondary | ICD-10-CM | POA: Insufficient documentation

## 2021-01-28 DIAGNOSIS — Z91040 Latex allergy status: Secondary | ICD-10-CM | POA: Diagnosis not present

## 2021-01-28 DIAGNOSIS — R110 Nausea: Secondary | ICD-10-CM | POA: Insufficient documentation

## 2021-01-28 LAB — COMPREHENSIVE METABOLIC PANEL
ALT: 22 U/L (ref 0–44)
AST: 22 U/L (ref 15–41)
Albumin: 3.9 g/dL (ref 3.5–5.0)
Alkaline Phosphatase: 78 U/L (ref 38–126)
Anion gap: 5 (ref 5–15)
BUN: 17 mg/dL (ref 6–20)
CO2: 30 mmol/L (ref 22–32)
Calcium: 9.5 mg/dL (ref 8.9–10.3)
Chloride: 104 mmol/L (ref 98–111)
Creatinine, Ser: 0.85 mg/dL (ref 0.44–1.00)
GFR, Estimated: >60 mL/min (ref >60)
Glucose, Bld: 103 mg/dL — ABNORMAL HIGH (ref 70–99)
Potassium: 3.5 mmol/L (ref 3.5–5.1)
Sodium: 139 mmol/L (ref 135–145)
Total Bilirubin: 0.7 mg/dL (ref 0.3–1.2)
Total Protein: 7.7 g/dL (ref 6.5–8.1)

## 2021-01-28 LAB — CBC WITH DIFFERENTIAL/PLATELET
Basophils Absolute: 0 10*3/uL (ref 0.0–0.1)
Basophils Relative: 1 %
Eosinophils Absolute: 0.2 10*3/uL (ref 0.0–0.5)
Eosinophils Relative: 2 %
HCT: 36.9 % (ref 36.0–46.0)
Hemoglobin: 12.5 g/dL (ref 12.0–15.0)
Lymphocytes Relative: 35 %
Lymphs Abs: 3 10*3/uL (ref 0.7–4.0)
MCH: 30.4 pg (ref 26.0–34.0)
MCHC: 33.9 g/dL (ref 30.0–36.0)
MCV: 89.8 fL (ref 80.0–100.0)
Monocytes Absolute: 0.7 10*3/uL (ref 0.1–1.0)
Monocytes Relative: 7 %
Neutro Abs: 4.9 10*3/uL (ref 1.7–7.7)
Neutrophils Relative %: 55 %
Platelets: 342 10*3/uL (ref 150–400)
RBC: 4.11 MIL/uL (ref 3.87–5.11)
RDW: 12.9 % (ref 11.5–15.5)
WBC: 9.1 10*3/uL (ref 4.0–10.5)
nRBC: 0 % (ref 0.0–0.2)

## 2021-01-28 LAB — URINALYSIS, COMPLETE (UACMP) WITH MICROSCOPIC
Bilirubin Urine: NEGATIVE
Glucose, UA: NEGATIVE mg/dL
Hgb urine dipstick: NEGATIVE
Ketones, ur: NEGATIVE mg/dL
Leukocytes,Ua: NEGATIVE
Nitrite: NEGATIVE
Protein, ur: NEGATIVE mg/dL
Specific Gravity, Urine: 1.021 (ref 1.005–1.030)
pH: 5 (ref 5.0–8.0)

## 2021-01-28 LAB — LIPASE, BLOOD: Lipase: 31 U/L (ref 11–51)

## 2021-01-28 MED ORDER — IOHEXOL 350 MG/ML SOLN
80.0000 mL | Freq: Once | INTRAVENOUS | Status: AC | PRN
  Administered 2021-01-28: 80 mL via INTRAVENOUS

## 2021-01-28 MED ORDER — PANTOPRAZOLE SODIUM 40 MG PO TBEC: 40.0000 mg | DELAYED_RELEASE_TABLET | Freq: Every day | ORAL | 1 refills | Status: DC

## 2021-01-28 MED ORDER — POLYETHYLENE GLYCOL 3350 17 GM/SCOOP PO POWD: ORAL | 0 refills | Status: AC

## 2021-01-28 NOTE — ED Provider Notes (Signed)
[facility] Emergency Department Provider Note   ____________________________________________   Event Date/Time   First MD Initiated Contact with Patient 01/28/21 [phone]     (approximate)  I have reviewed the triage vital signs and the nursing notes.   HISTORY  Chief Complaint Abdominal Pain    HPI Deanna Howe is a 50 y.o. female who reports 2 weeks of left upper quadrant pain radiating around through the back.  She reports she is having small bowel movements and passing gas but not having her usual regular bowel movements.  She is walking a lot and drinking coffee and doing other things to make her self go but is not working.  She has had prior appendectomy and cholecystectomy.  She feels like there is stool in there and even when she goes there is stool in there but she cannot get it all to come out she says she feels obstructed.  When she eats she has increasing crampy pain in the left upper quadrant.  It can be severe at times currently its not hurting.  She is not vomiting.  She is nauseated.         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 Authorizing Provider  pantoprazole (PROTONIX) 40 MG tablet Take 1  tablet (40 mg total) by mouth daily. 01/28/21 01/28/22 Yes Arnaldo Natal, MD  polyethylene glycol powder [facility]) 17 GM/SCOOP powder Use 1 scoop twice a day for about a week and then cut back to once a day after the week or after the first soft bowel movement.  That should give you a good bowel movement fairly soon. 01/28/21  Yes Arnaldo Natal, MD  buPROPion (WELLBUTRIN XL) 300 MG 24 hr tablet Take 1 tablet (300 mg total) by mouth daily. 09/28/20   Sallyanne Kuster, NP  ergocalciferol (VITAMIN D2) 1.25 MG (50000 UT) capsule Take by mouth. 08/04/18   [provider]  hydrochlorothiazide (HYDRODIURIL) 25 MG tablet Take 50 mg by mouth daily.    [provider]  Multiple Vitamins-Minerals (MULTIVITAMIN WITH MINERALS) tablet Take 1 tablet by mouth daily.    [provider]  venlafaxine XR (EFFEXOR-XR) 75 MG 24 hr capsule Take 150 mg by mouth daily with breakfast.    [provider]    Allergies Codeine, Dilaudid [hydromorphone hcl], Hydrocodone, Latex, Penicillins, Adhesive [tape], and Morphine and related  Family History  Problem Relation Age of Onset   Hypertension Mother    Diabetes Mother     Social History Social History   Tobacco Use   Smoking status: Never   Smokeless tobacco: Never  Vaping Use   Vaping  Use: Never used  Substance Use Topics   Alcohol use: No   Drug use: No    Review of Systems  Constitutional: No fever/chills Eyes: No visual changes. ENT: No sore throat. Cardiovascular: Denies chest pain. Respiratory: Denies shortness of breath. Gastrointestinal:  abdominal pain.   nausea, no vomiting.  No diarrhea.  No constipation. Genitourinary: Negative for dysuria. Musculoskeletal: Negative for back pain. Skin: Negative for rash. Neurological: Negative for headaches, focal weakness o  ____________________________________________   PHYSICAL EXAM:  VITAL SIGNS: ED Triage Vitals  Enc Vitals Group     BP 01/28/21 0723 (!)  143/85     Pulse Rate 01/28/21 0723 91     Resp 01/28/21 0723 18     Temp 01/28/21 0723 98.5 F (36.9 C)     Temp Source 01/28/21 0723 Oral     SpO2 01/28/21 0723 96 %     Weight 01/28/21 0721 240 lb (108.9 kg)     Height 01/28/21 0721 5\' 10"  (1.778 m)     Head Circumference --      Peak Flow --      Pain Score 01/28/21 0721 4     Pain Loc --      Pain Edu? --      Excl. in GC? --     Constitutional: Alert and oriented. Well appearing and in no acute distress. Eyes: Conjunctivae are normal. PER Head: Atraumatic. Nose: No congestion/rhinnorhea. Mouth/Throat: Mucous membranes are moist.  Oropharynx non-erythematous. Neck: No stridor.  Cardiovascular: Normal rate, regular rhythm. Grossly normal heart sounds.  Good peripheral circulation. Respiratory: Normal respiratory effort.  No retractions. Lungs CTAB. Gastrointestinal: Soft and nontender. No distention. No abdominal bruits. No CVA tenderness. Musculoskeletal: No lower extremity tenderness nor edema.   Neurologic:  Normal speech and language. No gross focal neurologic deficits are appreciated.  Skin:  Skin is warm, dry and intact. No rash noted.   ____________________________________________   LABS (all labs ordered are listed, but only abnormal results are displayed)  Labs Reviewed  COMPREHENSIVE METABOLIC PANEL - Abnormal; Notable for the following components:      Result Value   Glucose, Bld 103 (*)    All other components within normal limits  URINALYSIS, COMPLETE (UACMP) WITH MICROSCOPIC - Abnormal; Notable for the following components:   Color, Urine YELLOW (*)    APPearance CLEAR (*)    Bacteria, UA RARE (*)    All other components within normal limits  LIPASE, BLOOD  CBC WITH DIFFERENTIAL/PLATELET  DIFFERENTIAL   ____________________________________________  EKG EKG read interpreted by me shows normal sinus rhythm rate of 82 normal axis essentially normal  EKG  ____________________________________________  RADIOLOGY 03/30/21, personally viewed and evaluated these images (plain radiographs) as part of my medical decision making, as well as reviewing the written report by the radiologist.  ED MD interpretation: CT read by radiology reviewed by me only shows some increased stool burden.  Official radiology report(s): CT ABDOMEN PELVIS W CONTRAST  Result Date: 01/28/2021 CLINICAL DATA:  Left upper quadrant abdominal pain radiating into the back for the past 2 weeks with associated nausea. EXAM: CT ABDOMEN AND PELVIS WITH CONTRAST TECHNIQUE: Multidetector CT imaging of the abdomen and pelvis was performed using the standard protocol following bolus administration of intravenous contrast. CONTRAST:  66mL OMNIPAQUE IOHEXOL 350 MG/ML SOLN COMPARISON:  09/02/2012. FINDINGS: Lower chest: Clear lung bases. Hepatobiliary: Liver normal in size. Subcentimeter low-attenuation lesion, anterior segment 2, nonspecific, suspected to be a cyst. No  other liver masses or lesions. Status post cholecystectomy. No bile duct dilation. Pancreas: Unremarkable. No pancreatic ductal dilatation or surrounding inflammatory changes. Spleen: Normal in size without focal abnormality. Adrenals/Urinary Tract: No adrenal masses. Kidneys are normal in size, orientation and position with symmetric enhancement and excretion. Bilateral renal sinus cysts. No stones. No hydronephrosis. Normal ureters. Bladder minimally distended, otherwise unremarkable. Stomach/Bowel: Stomach is unremarkable. Small bowel and colon are normal in caliber. No wall thickening. No adjacent inflammation. Mild generalized increase in the colonic stool burden. Vascular/Lymphatic: No significant vascular findings are present. No enlarged abdominal or pelvic lymph nodes. Reproductive: Status post hysterectomy. No adnexal masses. Other: No abdominal wall hernia or abnormality. No abdominopelvic ascites.  Musculoskeletal: No fracture or acute finding.  No bone lesion. IMPRESSION: 1. No acute findings within the abdomen or pelvis. No bowel obstruction or inflammation. Findings to account the patient's abdominal pain. 2. Mild generalized increase in the colonic stool burden. 3. Bilateral renal sinus cysts. Electronically Signed   By: Amie Portland M.D.   On: 01/28/2021 10:23    ____________________________________________   PROCEDURES  Procedure(s) performed (including Critical Care):  Procedures   ____________________________________________   INITIAL IMPRESSION / ASSESSMENT AND PLAN / ED COURSE  Patient has had multiple abdominal surgeries.  It could be possible that she is having some adhesions giving her crampy pain and possibly a partial obstruction.  I will do a CT of her abdomen to evaluate this.  I have advised her that we do need the urine specimen.   ----------------------------------------- 10:53 AM on 01/28/2021 ----------------------------------------- CT only shows increased stool burden.  Patient's pain may just be from that or it could be that she is having little ulcer or gastritis and she has had that problem before.  I will give her some Protonix for that just in case and some MiraLAX.  I would try magnesium citrate but apparently that is all been recalled.  Patient will return for worsening pain fever or any other problems. After discussing with the patient and she is taking MiraLAX once a day.  I will have her up at 2 3 times a day until she has a soft bowel movement and then cut back to once a day again.  She will return for increasing pain fever vomiting or she feels sicker and follow-up with her doctor she is not better in a week.       ____________________________________________   FINAL CLINICAL IMPRESSION(S) / ED DIAGNOSES  Final diagnoses:  Left upper quadrant abdominal pain  Constipation, unspecified constipation type     ED Discharge Orders           Ordered    polyethylene glycol powder (MIRALAX) 17 GM/SCOOP powder        01/28/21 1051    pantoprazole (PROTONIX) 40 MG tablet  Daily        01/28/21 1051             Note:  This document was prepared using Dragon voice recognition software and may include unintentional dictation errors.    Arnaldo Natal, MD 01/28/21 1058

## 2021-01-28 NOTE — ED Triage Notes (Addendum)
Pt c/o LUQ abd pain that radiating into the back for the past 2 weeks with nausea. Pt is in NAD at present.states she has not been able to have a BM, "just passing gas"

## 2021-01-28 NOTE — ED Notes (Signed)
Patient transported to CT 

## 2021-01-28 NOTE — Discharge Instructions (Addendum)
The CT and lab work were okay.  I am going to give you a little bit of Protonix in case the pain is from your stomach any gastritis or ulcer.  I am also going to give you some MiraLAX because the only thing that did show up on the CT was some constipation.  That should help after a day or 2.  Please return here for worse pain fever or vomiting.  Please follow-up with your doctor if the constipation and or abdominal pain is not better after a week.

## 2021-03-18 ENCOUNTER — Encounter: Payer: Self-pay | Admitting: Nurse Practitioner

## 2021-03-18 ENCOUNTER — Ambulatory Visit (INDEPENDENT_AMBULATORY_CARE_PROVIDER_SITE_OTHER): Payer: BC Managed Care – PPO | Admitting: Nurse Practitioner

## 2021-03-18 ENCOUNTER — Other Ambulatory Visit: Payer: Self-pay

## 2021-03-18 ENCOUNTER — Telehealth: Payer: Self-pay

## 2021-03-18 VITALS — BP 137/90 | HR 85 | Temp 98.4°F | Resp 16 | Ht 69.5 in | Wt 273.6 lb

## 2021-03-18 DIAGNOSIS — Z6839 Body mass index (BMI) 39.0-39.9, adult: Secondary | ICD-10-CM

## 2021-03-18 DIAGNOSIS — E538 Deficiency of other specified B group vitamins: Secondary | ICD-10-CM | POA: Diagnosis not present

## 2021-03-18 DIAGNOSIS — E559 Vitamin D deficiency, unspecified: Secondary | ICD-10-CM

## 2021-03-18 DIAGNOSIS — R7303 Prediabetes: Secondary | ICD-10-CM

## 2021-03-18 DIAGNOSIS — F321 Major depressive disorder, single episode, moderate: Secondary | ICD-10-CM

## 2021-03-18 DIAGNOSIS — K5904 Chronic idiopathic constipation: Secondary | ICD-10-CM | POA: Diagnosis not present

## 2021-03-18 LAB — POCT GLYCOSYLATED HEMOGLOBIN (HGB A1C): Hemoglobin A1C: 5.9 % — AB (ref 4.0–5.6)

## 2021-03-18 MED ORDER — BUPROPION HCL ER (XL) 300 MG PO TB24: 300.0000 mg | ORAL_TABLET | Freq: Every day | ORAL | 1 refills | Status: DC

## 2021-03-18 MED ORDER — HYDROCHLOROTHIAZIDE 25 MG PO TABS: 50.0000 mg | ORAL_TABLET | Freq: Every day | ORAL | 1 refills | Status: DC

## 2021-03-18 MED ORDER — RYBELSUS 3 MG PO TABS: 3.0000 mg | ORAL_TABLET | Freq: Every day | ORAL | 3 refills | Status: DC

## 2021-03-18 MED ORDER — PANTOPRAZOLE SODIUM 40 MG PO TBEC
40.0000 mg | DELAYED_RELEASE_TABLET | Freq: Every day | ORAL | 1 refills | Status: DC
Start: 2021-03-18 — End: 2022-10-10

## 2021-03-18 MED ORDER — VENLAFAXINE HCL ER 75 MG PO CP24: 150.0000 mg | ORAL_CAPSULE | Freq: Every day | ORAL | 1 refills | Status: DC

## 2021-03-18 MED ORDER — LINACLOTIDE 145 MCG PO CAPS: 145.0000 ug | ORAL_CAPSULE | Freq: Every day | ORAL | 0 refills | Status: DC

## 2021-03-18 NOTE — Progress Notes (Signed)
[facility] [address] [place], [place] [address]  Internal MEDICINE  Office Visit Note  Patient Name: Deanna Howe  [id number]  [id number]  Date of Service: 03/18/2021  Chief Complaint  Patient presents with   Follow-up    Discuss new GI referal, low energy, nauseous   Depression   Hypertension    HPI [name] presents for a follow-up visit for hypertension and chronic constipation she is requesting a new referral to gastroenterology.  She wants to be referred to Dr. Norma Fredrickson.  She has chronic constipation and has used enemas, MiraLAX, milk of magnesia, and OTC colon cleansers.  She often has nausea, bloating, abdominal pain.  She reports having low energy and fatigue.  According to her labs she has prediabetes, her A1c was 5.8 several months ago.  Her A1c today is 5.9.  She is interested in trying medication to control her A1c as well as help with weight loss.  She does have several labs that have been ordered but have not been drawn yet.  She is reminded to get her labs drawn.    Current Medication: Outpatient Encounter Medications as of 03/18/2021  Medication Sig   ergocalciferol (VITAMIN D2) 1.25 MG (50000 UT) capsule Take by mouth.   linaclotide (LINZESS) 145 MCG CAPS capsule Take 1 capsule (145 mcg total) by mouth daily before breakfast.   Multiple Vitamins-Minerals (MULTIVITAMIN WITH MINERALS) tablet Take 1 tablet by mouth daily.   polyethylene glycol powder (MIRALAX) 17 GM/SCOOP powder Use 1 scoop twice a day for about a week and then cut back to once a day after the week or after the first soft bowel movement.  That should give you a good bowel movement fairly soon.   Semaglutide (RYBELSUS) 3 MG TABS Take 3 mg by mouth daily before breakfast. on empty stomach, with no more than 4 oz of water, wait 30 minutes before taking other medications, food or water.   [DISCONTINUED] buPROPion (WELLBUTRIN XL) 300 MG 24 hr tablet Take 1 tablet (300 mg total) by mouth daily.    [DISCONTINUED] hydrochlorothiazide (HYDRODIURIL) 25 MG tablet Take 50 mg by mouth daily.   [DISCONTINUED] pantoprazole (PROTONIX) 40 MG tablet Take 1 tablet (40 mg total) by mouth daily.   [DISCONTINUED] venlafaxine XR (EFFEXOR-XR) 75 MG 24 hr capsule Take 150 mg by mouth daily with breakfast.   buPROPion (WELLBUTRIN XL) 300 MG 24 hr tablet Take 1 tablet (300 mg total) by mouth daily.   hydrochlorothiazide (HYDRODIURIL) 25 MG tablet Take 2 tablets (50 mg total) by mouth daily.   pantoprazole (PROTONIX) 40 MG tablet Take 1 tablet (40 mg total) by mouth daily.   venlafaxine XR (EFFEXOR-XR) 75 MG 24 hr capsule Take 2 capsules (150 mg total) by mouth daily with breakfast.   No facility-administered encounter medications on file as of 03/18/2021.    Surgical History: Past Surgical History:  Procedure Laterality Date   ABDOMINAL HYSTERECTOMY     APPENDECTOMY     CESAREAN SECTION     CHOLECYSTECTOMY     INCISION AND DRAINAGE Left 09/06/2012   Procedure: INCISION AND DRAINAGE;  Surgeon: Zelphia Cairo, MD;  Location: [facility];  Service: Gynecology;  Laterality: Left;  of ovarian cyst   LAPAROSCOPY N/A 09/06/2012   Procedure: LAPAROSCOPY OPERATIVE;  Surgeon: Zelphia Cairo, MD;  Location: [facility];  Service: Gynecology;  Laterality: N/A;   OOPHORECTOMY     RT side    Medical History: Past Medical History:  Diagnosis Date   Depression  Hypertension    Peptic ulcer due to Helicobacter pylori     Family History: Family History  Problem Relation Age of Onset   Hypertension Mother    Diabetes Mother     Social History   Socioeconomic History   Marital status: Divorced    Spouse name: Not on file   Number of children: Not on file   Years of education: Not on file   Highest education level: Not on file  Occupational History   Not on file  Tobacco Use   Smoking status: Never   Smokeless tobacco: Never  Vaping Use   Vaping Use: Never used  Substance and Sexual Activity   Alcohol  use: No   Drug use: No   Sexual activity: Not Currently  Other Topics Concern   Not on file  Social History Narrative   Not on file   Social Determinants of Health   Financial Resource Strain: Not on file  Food Insecurity: Not on file  Transportation Needs: Not on file  Physical Activity: Not on file  Stress: Not on file  Social Connections: Not on file  Intimate Partner Violence: Not on file      Review of Systems  Constitutional:  Positive for fatigue. Negative for chills and unexpected weight change.  HENT:  Negative for congestion, rhinorrhea, sneezing and sore throat.   Eyes:  Negative for redness.  Respiratory:  Negative for cough, chest tightness, shortness of breath and wheezing.   Cardiovascular:  Negative for chest pain and palpitations.  Gastrointestinal:  Positive for abdominal pain, constipation and nausea. Negative for diarrhea and vomiting.  Genitourinary:  Negative for dysuria and frequency.  Musculoskeletal:  Negative for arthralgias, back pain, joint swelling and neck pain.  Skin:  Negative for rash.  Neurological: Negative.  Negative for dizziness, tremors, numbness and headaches.  Hematological:  Negative for adenopathy. Does not bruise/bleed easily.  Psychiatric/Behavioral:  Negative for behavioral problems (Depression), self-injury, sleep disturbance and suicidal ideas. The patient is not nervous/anxious.    Vital Signs: BP 137/90   Pulse 85   Temp 98.4 F (36.9 C)   Resp 16   Ht 5' 9.5" (1.765 m)   Wt 273 lb 9.6 oz (124.1 kg)   SpO2 99%   BMI 39.82 kg/m    Physical Exam Vitals reviewed.  Constitutional:      General: She is not in acute distress.    Appearance: Normal appearance. She is obese. She is not ill-appearing.  HENT:     Head: Normocephalic and atraumatic.  Eyes:     Pupils: Pupils are equal, round, and reactive to light.  Cardiovascular:     Rate and Rhythm: Normal rate and regular rhythm.  Pulmonary:     Effort: Pulmonary  effort is normal. No respiratory distress.  Neurological:     Mental Status: She is alert and oriented to person, place, and time.     Cranial Nerves: No cranial nerve deficit.     Coordination: Coordination normal.     Gait: Gait normal.  Psychiatric:        Mood and Affect: Mood normal.        Behavior: Behavior normal.       Assessment/Plan: 1. Prediabetes A1c is 5.9, start rybelsus, follow up in 1 month. - POCT glycosylated hemoglobin (Hb A1C) - Semaglutide (RYBELSUS) 3 MG TABS; Take 3 mg by mouth daily before breakfast. on empty stomach, with no more than 4 oz of water, wait 30 minutes before taking  other medications, food or water.  Dispense: 30 tablet; Refill: 3  2. Chronic idiopathic constipation Refer to GI for chronic constipation, bloating and abdominal pain - Ambulatory referral to Gastroenterology - linaclotide (LINZESS) 145 MCG CAPS capsule; Take 1 capsule (145 mcg total) by mouth daily before breakfast.  Dispense: 30 capsule; Refill: 0  3. B12 deficiency B12 lab already ordered, patient reminded to get labs drawn  4. Vitamin D deficiency Rule out low vitamin D - Vitamin D (25 hydroxy)  5. Depression, major, single episode, moderate (HCC) Continue wellbutrin - buPROPion (WELLBUTRIN XL) 300 MG 24 hr tablet; Take 1 tablet (300 mg total) by mouth daily.  Dispense: 90 tablet; Refill: 1  6. Body mass index (BMI) of 39.0 to 39.9 in adult Start rybelsus - Semaglutide (RYBELSUS) 3 MG TABS; Take 3 mg by mouth daily before breakfast. on empty stomach, with no more than 4 oz of water, wait 30 minutes before taking other medications, food or water.  Dispense: 30 tablet; Refill: 3   General Counseling: Zalma verbalizes understanding of the findings of todays visit and agrees with plan of treatment. I have discussed any further diagnostic evaluation that may be needed or ordered today. We also reviewed her medications today. she has been encouraged to call the office with any  questions or concerns that should arise related to todays visit.    Orders Placed This Encounter  Procedures   HIV Antibody (routine testing w rflx)   HM HEPATITIS C SCREENING LAB   Vitamin D (25 hydroxy)   Ambulatory referral to Gastroenterology   POCT glycosylated hemoglobin (Hb A1C)    Meds ordered this encounter  Medications   linaclotide (LINZESS) 145 MCG CAPS capsule    Sig: Take 1 capsule (145 mcg total) by mouth daily before breakfast.    Dispense:  30 capsule    Refill:  0   buPROPion (WELLBUTRIN XL) 300 MG 24 hr tablet    Sig: Take 1 tablet (300 mg total) by mouth daily.    Dispense:  90 tablet    Refill:  1   venlafaxine XR (EFFEXOR-XR) 75 MG 24 hr capsule    Sig: Take 2 capsules (150 mg total) by mouth daily with breakfast.    Dispense:  180 capsule    Refill:  1   pantoprazole (PROTONIX) 40 MG tablet    Sig: Take 1 tablet (40 mg total) by mouth daily.    Dispense:  90 tablet    Refill:  1   hydrochlorothiazide (HYDRODIURIL) 25 MG tablet    Sig: Take 2 tablets (50 mg total) by mouth daily.    Dispense:  180 tablet    Refill:  1   Semaglutide (RYBELSUS) 3 MG TABS    Sig: Take 3 mg by mouth daily before breakfast. on empty stomach, with no more than 4 oz of water, wait 30 minutes before taking other medications, food or water.    Dispense:  30 tablet    Refill:  3    Return in about 1 month (around 04/17/2021) for F/U, eval new med, Jaydee Conran PCP.   Total time spent:30 Minutes Time spent includes review of chart, medications, test results, and follow up plan with the patient.   [place] Controlled Substance Database was reviewed by me.  This patient was seen by Sallyanne Kuster, FNP-C in collaboration with Dr. Beverely Risen as a part of collaborative care agreement.   Shequila Neglia R. Tedd Sias, MSN, FNP-C Internal medicine

## 2021-03-18 NOTE — Telephone Encounter (Signed)
Awaiting 03/18/21 office notes for referral-Toni

## 2021-04-01 NOTE — Telephone Encounter (Signed)
GI referral sent via [facility] to [facility]-Toni 

## 2021-04-04 ENCOUNTER — Encounter: Payer: Self-pay | Admitting: Nurse Practitioner

## 2021-04-08 NOTE — Telephone Encounter (Signed)
GI appointment 05/08/21 @ 10:00 w/ Dr. Patrica Duel

## 2021-04-15 ENCOUNTER — Ambulatory Visit: Payer: BC Managed Care – PPO | Admitting: Nurse Practitioner

## 2021-05-16 ENCOUNTER — Encounter: Payer: BC Managed Care – PPO | Admitting: Nurse Practitioner

## 2021-05-23 ENCOUNTER — Telehealth: Payer: Self-pay

## 2021-05-23 NOTE — Telephone Encounter (Signed)
Left vm and sent mychart message to confirm 05/27/21 appointment-Toni °

## 2021-05-27 ENCOUNTER — Other Ambulatory Visit: Payer: Self-pay

## 2021-05-27 ENCOUNTER — Ambulatory Visit (INDEPENDENT_AMBULATORY_CARE_PROVIDER_SITE_OTHER): Payer: BC Managed Care – PPO | Admitting: Nurse Practitioner

## 2021-05-27 ENCOUNTER — Encounter: Payer: Self-pay | Admitting: Nurse Practitioner

## 2021-05-27 VITALS — BP 110/80 | HR 90 | Temp 98.4°F | Resp 16 | Ht 70.0 in | Wt 267.4 lb

## 2021-05-27 DIAGNOSIS — R300 Dysuria: Secondary | ICD-10-CM

## 2021-05-27 DIAGNOSIS — Z020 Encounter for examination for admission to educational institution: Secondary | ICD-10-CM

## 2021-05-27 DIAGNOSIS — R7303 Prediabetes: Secondary | ICD-10-CM

## 2021-05-27 DIAGNOSIS — K5904 Chronic idiopathic constipation: Secondary | ICD-10-CM

## 2021-05-27 DIAGNOSIS — Z0001 Encounter for general adult medical examination with abnormal findings: Secondary | ICD-10-CM

## 2021-05-27 DIAGNOSIS — Z23 Encounter for immunization: Secondary | ICD-10-CM

## 2021-05-27 DIAGNOSIS — Z1231 Encounter for screening mammogram for malignant neoplasm of breast: Secondary | ICD-10-CM

## 2021-05-27 DIAGNOSIS — I10 Essential (primary) hypertension: Secondary | ICD-10-CM | POA: Diagnosis not present

## 2021-05-27 MED ORDER — TETANUS-DIPHTH-ACELL PERTUSSIS 5-2.5-18.5 LF-MCG/0.5 IM SUSP: 0.5000 mL | Freq: Once | INTRAMUSCULAR | 0 refills | Status: AC

## 2021-05-27 NOTE — Progress Notes (Signed)
[facility] [address] [place], [place] [address]  Internal MEDICINE  Office Visit Note  Patient Name: Deanna Howe  [id number]  [id number]  Date of Service: 05/27/2021  Chief Complaint  Patient presents with   Annual Exam   Depression   Hypertension    HPI Deanna Howe presents for an annual well visit and physical exam.She is a well appearing 51 yo female with hypertension, anxiety, depression, constipation, and GERD. She is scheduled for colonoscopy on march 20th. She is due for routine mammogram. 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 She needs titers for vaccinations as well as her physical filled out.  She is interested in bariatric surgery.  Her blood pressure is well controlled on current medications.  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 Her last chest xray was in 2019 and was normal.     Current Medication: Outpatient Encounter Medications as of 05/27/2021  Medication Sig   buPROPion (WELLBUTRIN XL) 300 MG 24 hr tablet Take 1 tablet (300 mg total) by mouth daily.   ergocalciferol (VITAMIN D2) 1.25 MG (50000 UT) capsule Take by mouth.   hydrochlorothiazide (HYDRODIURIL) 25 MG tablet Take 2 tablets (50 mg total) by mouth daily.   linaclotide (LINZESS) 145 MCG CAPS capsule Take 1 capsule (145 mcg total) by mouth daily before breakfast.   Multiple Vitamins-Minerals (MULTIVITAMIN WITH MINERALS) tablet Take 1 tablet by mouth daily.   pantoprazole (PROTONIX) 40 MG tablet Take 1 tablet (40 mg total) by mouth daily.   polyethylene glycol powder (MIRALAX) 17 GM/SCOOP powder Use 1 scoop twice a day for about a week and then cut back to once a day after the week or after the first soft bowel movement.  That should give you a good bowel movement fairly soon.   Semaglutide (RYBELSUS) 3 MG TABS Take 3 mg by mouth daily before breakfast. on empty stomach, with  no more than 4 oz of water, wait 30 minutes before taking other medications, food or water.   [EXPIRED] Tdap (BOOSTRIX) 5-2.5-18.5 LF-MCG/0.5 injection Inject 0.5 mLs into the muscle once for 1 dose.   venlafaxine XR (EFFEXOR-XR) 75 MG 24 hr capsule Take 2 capsules (150 mg total) by mouth daily with breakfast.   No facility-administered encounter medications on file as of 05/27/2021.    Surgical History: Past Surgical History:  Procedure Laterality Date   ABDOMINAL HYSTERECTOMY     APPENDECTOMY     CESAREAN SECTION     CHOLECYSTECTOMY     INCISION AND DRAINAGE Left 09/06/2012   Procedure: INCISION AND DRAINAGE;  Surgeon: Zelphia Cairo, MD;  Location: [facility];  Service: Gynecology;  Laterality: Left;  of ovarian cyst   LAPAROSCOPY N/A 09/06/2012   Procedure: LAPAROSCOPY OPERATIVE;  Surgeon: Zelphia Cairo, MD;  Location: [facility];  Service: Gynecology;  Laterality: N/A;   OOPHORECTOMY     RT side    Medical History: Past Medical History:  Diagnosis Date   Depression    Hypertension    Peptic ulcer due to Helicobacter pylori     Family History: Family History  Problem Relation Age of Onset   Hypertension Mother    Diabetes Mother     Social History   Socioeconomic History   Marital status: Divorced    Spouse name: Not on file   Number of children: Not on file  Years of education: Not on file   Highest education level: Not on file  Occupational History   Not on file  Tobacco Use   Smoking status: Never   Smokeless tobacco: Never  Vaping Use   Vaping Use: Never used  Substance and Sexual Activity   Alcohol use: No   Drug use: No   Sexual activity: Not Currently  Other Topics Concern   Not on file  Social History Narrative   Not on file   Social Determinants of Health   Financial Resource Strain: Not on file  Food Insecurity: Not on file  Transportation Needs: Not on file  Physical Activity: Not on file  Stress: Not on file  Social Connections: Not on file   Intimate Partner Violence: Not on file      Review of Systems  Constitutional:  Negative for activity change, appetite change, chills, fatigue, fever and unexpected weight change.  HENT: Negative.  Negative for congestion, ear pain, rhinorrhea, sore throat and trouble swallowing.   Eyes: Negative.   Respiratory: Negative.  Negative for cough, chest tightness, shortness of breath and wheezing.   Cardiovascular: Negative.  Negative for chest pain.  Gastrointestinal: Negative.  Negative for abdominal pain, blood in stool, constipation, diarrhea, nausea and vomiting.  Endocrine: Negative.   Genitourinary: Negative.  Negative for difficulty urinating, dysuria, frequency, hematuria and urgency.  Musculoskeletal: Negative.  Negative for arthralgias, back pain, joint swelling, myalgias and neck pain.  Skin: Negative.  Negative for rash and wound.  Allergic/Immunologic: Negative.  Negative for immunocompromised state.  Neurological: Negative.  Negative for dizziness, seizures, numbness and headaches.  Hematological: Negative.   Psychiatric/Behavioral:  Positive for depression. Negative for behavioral problems, self-injury and suicidal ideas. The patient is not nervous/anxious.    Vital Signs: BP 110/80    Pulse 90    Temp 98.4 F (36.9 C)    Resp 16    Ht 5\' 10"  (1.778 m)    Wt 267 lb 6.4 oz (121.3 kg)    SpO2 98%    BMI 38.37 kg/m    Physical Exam Vitals reviewed.  Constitutional:      General: She is awake. She is not in acute distress.    Appearance: Normal appearance. She is well-developed and well-groomed. She is obese. She is not ill-appearing or diaphoretic.  HENT:     Head: Normocephalic and atraumatic.     Right Ear: Tympanic membrane, ear canal and external ear normal.     Left Ear: Tympanic membrane, ear canal and external ear normal.     Nose: Nose normal. No congestion or rhinorrhea.     Mouth/Throat:     Lips: Pink.     Mouth: Mucous membranes are moist.     Pharynx:  Oropharynx is clear. Uvula midline. No oropharyngeal exudate or posterior oropharyngeal erythema.  Eyes:     General: Lids are normal. Vision grossly intact. Gaze aligned appropriately. No scleral icterus.       Right eye: No discharge.        Left eye: No discharge.     Extraocular Movements: Extraocular movements intact.     Conjunctiva/sclera: Conjunctivae normal.     Pupils: Pupils are equal, round, and reactive to light.     Funduscopic exam:    Right eye: Red reflex present.        Left eye: Red reflex present. Neck:     Thyroid: No thyromegaly.     Vascular: No JVD.     Trachea:  Trachea and phonation normal. No tracheal deviation.  Cardiovascular:     Rate and Rhythm: Normal rate and regular rhythm.     Pulses: Normal pulses.     Heart sounds: Normal heart sounds, S1 normal and S2 normal. No murmur heard.   No friction rub. No gallop.  Pulmonary:     Effort: Pulmonary effort is normal. No accessory muscle usage or respiratory distress.     Breath sounds: Normal breath sounds and air entry. No stridor. No wheezing or rales.  Chest:     Chest wall: No tenderness.     Comments: Declined clinical breast exam, mammogram ordered.  Abdominal:     General: Bowel sounds are normal. There is no distension.     Palpations: Abdomen is soft. There is no shifting dullness, fluid wave, mass or pulsatile mass.     Tenderness: There is no abdominal tenderness. There is no guarding or rebound.  Musculoskeletal:        General: No tenderness or deformity. Normal range of motion.     Cervical back: Normal range of motion and neck supple.     Right lower leg: No edema.     Left lower leg: No edema.  Lymphadenopathy:     Cervical: No cervical adenopathy.  Skin:    General: Skin is warm and dry.     Capillary Refill: Capillary refill takes less than 2 seconds.     Coloration: Skin is not pale.     Findings: No erythema or rash.  Neurological:     Mental Status: She is alert and oriented to  person, place, and time.     Cranial Nerves: No cranial nerve deficit.     Motor: No abnormal muscle tone.     Coordination: Coordination normal.     Gait: Gait normal.     Deep Tendon Reflexes: Reflexes are normal and symmetric.  Psychiatric:        Mood and Affect: Mood and affect normal.        Behavior: Behavior normal. Behavior is cooperative.        Thought Content: Thought content normal.        Judgment: Judgment normal.       Assessment/Plan: 1. Encounter for routine adult health examination with abnormal findings Age-appropriate preventive screenings and vaccinations discussed, annual physical exam completed. Routine labs for health maintenance ordered previously, patient reminded to get labs drawn. PHM updated.   2. Essential hypertension Blood pressure is well controlled with hydrochlorothiazide, continue as prescribed.   3. Prediabetes A1C in November was 5.9. patient is aware of this and has been making recommended diet modifications to improve her A1C.   4. Chronic idiopathic constipation Stable, Takes linzess and miralax  5. Dysuria Routine urinalysis done - UA/M w/rflx Culture, Routine - Microscopic Examination  6. Encounter for examination for admission to educational institution Labs ordered for immunity titers for nursing school. Paperwork filled out for health and physical exam for school.  - Varicella Zoster Abs, IgG/IgM - Hepatitis B surface antibody,quantitative - Measles/Mumps/Rubella Immunity  7. Encounter for screening mammogram for malignant neoplasm of breast Routine mammogram ordered.  - MM 3D SCREEN BREAST BILATERAL; Future  8. Need for vaccination - Tdap ([place]) 5-2.5-18.5 LF-MCG/0.5 injection; Inject 0.5 mLs into the muscle once for 1 dose.  Dispense: 0.5 mL; Refill: 0      General Counseling: Viktoriya verbalizes understanding of the findings of todays visit and agrees with plan of treatment. I have discussed any further  diagnostic  evaluation that may be needed or ordered today. We also reviewed her medications today. she has been encouraged to call the office with any questions or concerns that should arise related to todays visit.    Orders Placed This Encounter  Procedures   Microscopic Examination   MM 3D SCREEN BREAST BILATERAL   UA/M w/rflx Culture, Routine   Varicella Zoster Abs, IgG/IgM   Hepatitis B surface antibody,quantitative   Measles/Mumps/Rubella Immunity    Meds ordered this encounter  Medications   Tdap (BOOSTRIX) 5-2.5-18.5 LF-MCG/0.5 injection    Sig: Inject 0.5 mLs into the muscle once for 1 dose.    Dispense:  0.5 mL    Refill:  0    Return in about 1 month (around 06/25/2021) for F/U, Recheck A1C, Vishruth Seoane PCP.   Total time spent:30 Minutes Time spent includes review of chart, medications, test results, and follow up plan with the patient.   [place] Controlled Substance Database was reviewed by me.  This patient was seen by Jonetta Osgood, FNP-C in collaboration with Dr. Clayborn Bigness as a part of collaborative care agreement.  Jaylenn Altier R. Valetta Fuller, MSN, FNP-C Internal medicine

## 2021-05-28 LAB — CBC WITH DIFFERENTIAL/PLATELET
Basophils Absolute: 0 10*3/uL (ref 0.0–0.2)
Basos: 1 %
EOS (ABSOLUTE): 0.1 10*3/uL (ref 0.0–0.4)
Eos: 2 %
Hematocrit: 38.5 % (ref 34.0–46.6)
Hemoglobin: 12.9 g/dL (ref 11.1–15.9)
Immature Grans (Abs): 0 10*3/uL (ref 0.0–0.1)
Immature Granulocytes: 0 %
Lymphocytes Absolute: 1.6 10*3/uL (ref 0.7–3.1)
Lymphs: 32 %
MCH: 29.6 pg (ref 26.6–33.0)
MCHC: 33.5 g/dL (ref 31.5–35.7)
MCV: 88 fL (ref 79–97)
Monocytes Absolute: 0.4 10*3/uL (ref 0.1–0.9)
Monocytes: 8 %
Neutrophils Absolute: 2.9 10*3/uL (ref 1.4–7.0)
Neutrophils: 57 %
Platelets: 373 10*3/uL (ref 150–450)
RBC: 4.36 x10E6/uL (ref 3.77–5.28)
RDW: 12.6 % (ref 11.7–15.4)
WBC: 5 10*3/uL (ref 3.4–10.8)

## 2021-05-28 LAB — CMP14+EGFR
ALT: 16 IU/L (ref 0–32)
AST: 17 IU/L (ref 0–40)
Albumin/Globulin Ratio: 1.6 (ref 1.2–2.2)
Albumin: 4.4 g/dL (ref 3.8–4.9)
Alkaline Phosphatase: 77 IU/L (ref 44–121)
BUN/Creatinine Ratio: 11 (ref 9–23)
BUN: 10 mg/dL (ref 6–24)
Bilirubin Total: 0.4 mg/dL (ref 0.0–1.2)
CO2: 27 mmol/L (ref 20–29)
Calcium: 9.5 mg/dL (ref 8.7–10.2)
Chloride: 103 mmol/L (ref 96–106)
Creatinine, Ser: 0.93 mg/dL (ref 0.57–1.00)
Globulin, Total: 2.8 g/dL (ref 1.5–4.5)
Glucose: 89 mg/dL (ref 70–99)
Potassium: 4.3 mmol/L (ref 3.5–5.2)
Sodium: 140 mmol/L (ref 134–144)
Total Protein: 7.2 g/dL (ref 6.0–8.5)
eGFR: 74 mL/min/{1.73_m2} (ref >59)

## 2021-05-28 LAB — UA/M W/RFLX CULTURE, ROUTINE
Bilirubin, UA: NEGATIVE
Glucose, UA: NEGATIVE
Leukocytes,UA: NEGATIVE
Nitrite, UA: NEGATIVE
Protein,UA: NEGATIVE
RBC, UA: NEGATIVE
Specific Gravity, UA: 1.019 (ref 1.005–1.030)
Urobilinogen, Ur: 1 mg/dL (ref 0.2–1.0)
pH, UA: 5.5 (ref 5.0–7.5)

## 2021-05-28 LAB — B12 AND FOLATE PANEL
Folate: 13 ng/mL (ref >3.0)
Vitamin B-12: 578 pg/mL (ref 232–1245)

## 2021-05-28 LAB — VITAMIN D 25 HYDROXY (VIT D DEFICIENCY, FRACTURES): Vit D, 25-Hydroxy: 25.3 ng/mL — ABNORMAL LOW (ref 30.0–100.0)

## 2021-05-28 LAB — MICROSCOPIC EXAMINATION
Bacteria, UA: NONE SEEN
Casts: NONE SEEN /lpf
RBC, Urine: NONE SEEN /hpf (ref 0–2)

## 2021-05-28 LAB — TSH+FREE T4
Free T4: 0.97 ng/dL (ref 0.82–1.77)
TSH: 0.563 u[IU]/mL (ref 0.450–4.500)

## 2021-05-28 LAB — PROGESTERONE: Progesterone: 0.2 ng/mL

## 2021-05-29 LAB — HEPATITIS B SURFACE ANTIBODY, QUANTITATIVE: Hepatitis B Surf Ab Quant: >1000 m[IU]/mL (ref >9.9)

## 2021-05-29 LAB — VARICELLA ZOSTER ABS, IGG/IGM
Varicella IgM: <0.91 index (ref 0.00–0.90)
Varicella zoster IgG: >4000 index (ref >165)

## 2021-05-29 LAB — MEASLES/MUMPS/RUBELLA IMMUNITY
MUMPS ABS, IGG: 10.2 AU/mL — ABNORMAL LOW (ref >10.9)
RUBEOLA AB, IGG: >300 AU/mL (ref >16.4)
Rubella Antibodies, IGG: 29.3 index (ref >0.99)

## 2021-05-31 ENCOUNTER — Telehealth: Payer: Self-pay

## 2021-05-31 NOTE — Telephone Encounter (Signed)
Left voicemail for patient advising their student health paperwork is ready for pickup at the front desk.

## 2021-06-07 NOTE — Progress Notes (Signed)
I have reviewed the lab results. There are no critically abnormal values requiring immediate intervention but there are some abnormals that will be discussed at the next office visit.  

## 2021-06-08 ENCOUNTER — Encounter: Payer: Self-pay | Admitting: Nurse Practitioner

## 2021-06-20 ENCOUNTER — Telehealth: Payer: Self-pay

## 2021-06-20 DIAGNOSIS — Z7689 Persons encountering health services in other specified circumstances: Secondary | ICD-10-CM

## 2021-06-24 ENCOUNTER — Ambulatory Visit: Payer: BC Managed Care – PPO | Admitting: Nurse Practitioner

## 2021-06-24 MED ORDER — ERGOCALCIFEROL 1.25 MG (50000 UT) PO CAPS: 50000.0000 [IU] | ORAL_CAPSULE | ORAL | 3 refills | Status: AC

## 2021-06-24 NOTE — Telephone Encounter (Signed)
Spoke to pt and informed her that [name] sent in Vit D to her pharmacy, and also [name] set back a sample of the [facility] for pt to try and I informed her that she can send in rx to her pharmacy.  Pt did intend on cancelling appt for 06/24/21 due to her having to work but we rescheduled the appt for 06/26/21

## 2021-06-25 ENCOUNTER — Emergency Department
Admission: EM | Admit: 2021-06-25 | Discharge: 2021-06-25 | Disposition: A | Discharge status: Home / Self Care | Payer: BC Managed Care – PPO | Attending: Emergency Medicine | Admitting: Emergency Medicine

## 2021-06-25 ENCOUNTER — Other Ambulatory Visit: Payer: Self-pay

## 2021-06-25 ENCOUNTER — Emergency Department: Payer: BC Managed Care – PPO

## 2021-06-25 DIAGNOSIS — M79604 Pain in right leg: Secondary | ICD-10-CM | POA: Diagnosis present

## 2021-06-25 DIAGNOSIS — R1032 Left lower quadrant pain: Secondary | ICD-10-CM | POA: Diagnosis not present

## 2021-06-25 DIAGNOSIS — M25559 Pain in unspecified hip: Secondary | ICD-10-CM | POA: Diagnosis not present

## 2021-06-25 DIAGNOSIS — R590 Localized enlarged lymph nodes: Secondary | ICD-10-CM | POA: Insufficient documentation

## 2021-06-25 MED ORDER — DOXYCYCLINE MONOHYDRATE 100 MG PO TABS: 100.0000 mg | ORAL_TABLET | Freq: Two times a day (BID) | ORAL | 0 refills | Status: DC

## 2021-06-25 NOTE — Discharge Instructions (Addendum)
The blood clot ultrasound was negative.  It looks like the pain is just from the swollen glands in your groin.  I will give you some antibiotics.  I will give you doxycycline 1 pill 2 times a day.  This should take care of it.  If the pain not any better in 2 or 3 days or not gone in a week please return or follow-up with your regular doctor.

## 2021-06-25 NOTE — ED Triage Notes (Signed)
Pt c/o pain starting in the left groin area and radiates into the thigh for the past week , states she traveled to [place] about 2 weeks ago. Has been taking naproxen with no relief

## 2021-06-25 NOTE — ED Provider Notes (Addendum)
[facility] Provider Note    Event Date/Time   First MD Initiated Contact with Patient 06/25/21 [phone]     (approximate)   History   Leg Pain   HPI  Deanna Howe is a 51 y.o. female who reports some hip pain which predates the trip she took to [place] about 2 weeks ago.  Since then the hip began hurting a little bit more and she developed pain in the groin which is quite tender.  She has a nontender lump on the other side but a lot of pain in the left groin which is worse when she walks.      Physical Exam   Triage Vital Signs: ED Triage Vitals [06/25/21 0752]  Enc Vitals Group     BP (!) 156/85     Pulse Rate 68     Resp 17     Temp 98.3 F (36.8 C)     Temp Source Oral     SpO2 100 %     Weight 267 lb (121.1 kg)     Height 5\' 10"  (1.778 m)     Head Circumference      Peak Flow      Pain Score      Pain Loc      Pain Edu?      Excl. in GC?     Most recent vital signs: Vitals:   06/25/21 0752  BP: (!) 156/85  Pulse: 68  Resp: 17  Temp: 98.3 F (36.8 C)  SpO2: 100%    General: Awake, no distress.  CV:  Good peripheral perfusion.  Resp:  Normal effort.  Abd:  No distention.  Nontender Extremities: Normal capillary refill normal temperature no edema there is no tenderness in the calf or knee no tenderness in the thigh but in the upper thigh right and crease between the body and the leg there is some tenderness and with the nurse in the room I examined her more closely she has 1 or 2 about 1 cm nodes medially at the top of the thigh.  Palpation of these nodes exactly reproduces the patient's pain.  ED Results / Procedures / Treatments   Labs (all labs ordered are listed, but only abnormal results are displayed) Labs Reviewed - No data to display   EKG     RADIOLOGY    PROCEDURES:  Critical Care performed:   Procedures   MEDICATIONS ORDERED IN ED: Medications - No data to display   IMPRESSION / MDM / ASSESSMENT  AND PLAN / ED COURSE  I reviewed the triage vital signs and the nursing notes. Patient denies any vaginal discharge and any worry about STDs.  Patient reports her husband had a similar problem was started on Cipro but then got allergic to it and switch to Doxy.  This is somewhat worrisome but since he was started on Cipro first I am less concerned.  This seems to have worked although he also had an allergic symptoms to that.  I will try the patient on doxycycline 100 mg twice a day for 10 days.  We will have her follow-up with her doctor or return if she is worse or no better in 2 or 3 days. I do not believe the patient needs admission for this.     FINAL CLINICAL IMPRESSION(S) / ED DIAGNOSES   Final diagnoses:  Inguinal lymphadenopathy     Rx / DC Orders   ED Discharge Orders  Ordered    doxycycline (ADOXA) 100 MG tablet  2 times daily        06/25/21 3875             Note:  This document was prepared using Dragon voice recognition software and may include unintentional dictation errors.   Arnaldo Natal, MD 06/25/21 1001 I have suggested that she use Tylenol or Motrin as needed for the pain.   Arnaldo Natal, MD 06/25/21 1003

## 2021-06-25 NOTE — ED Notes (Signed)
Pt in with co left groin pain states worse when she walks. Has had symptoms for few days, no known injury. States she does walk a lot at work, no hx of the same.

## 2021-06-26 ENCOUNTER — Encounter: Payer: Self-pay | Admitting: Nurse Practitioner

## 2021-06-26 ENCOUNTER — Ambulatory Visit (INDEPENDENT_AMBULATORY_CARE_PROVIDER_SITE_OTHER): Payer: BC Managed Care – PPO | Admitting: Nurse Practitioner

## 2021-06-26 VITALS — BP 120/69 | HR 88 | Temp 98.4°F | Resp 16 | Ht 70.0 in | Wt 271.6 lb

## 2021-06-26 DIAGNOSIS — I10 Essential (primary) hypertension: Secondary | ICD-10-CM | POA: Diagnosis not present

## 2021-06-26 DIAGNOSIS — R7303 Prediabetes: Secondary | ICD-10-CM | POA: Diagnosis not present

## 2021-06-26 DIAGNOSIS — E661 Drug-induced obesity: Secondary | ICD-10-CM | POA: Diagnosis not present

## 2021-06-26 DIAGNOSIS — Z6838 Body mass index (BMI) 38.0-38.9, adult: Secondary | ICD-10-CM | POA: Diagnosis not present

## 2021-06-26 LAB — POCT GLYCOSYLATED HEMOGLOBIN (HGB A1C): Hemoglobin A1C: 5.6 % (ref 4.0–5.6)

## 2021-06-26 MED ORDER — SEMAGLUTIDE(0.25 OR 0.5MG/DOS) 2 MG/1.5ML ~~LOC~~ SOPN
0.5000 mg | PEN_INJECTOR | SUBCUTANEOUS | 2 refills | Status: DC
Start: 2021-07-17 — End: 2021-09-13

## 2021-06-26 NOTE — Progress Notes (Signed)
[facility] ?[address] ?[place], [place] [address] ? ?Internal MEDICINE  ?Office Visit Note ? ?Patient Name: Deanna Howe ? [id number]  ?[id number] ? ?Date of Service: 06/26/2021 ? ?Chief Complaint  ?Patient presents with  ? Follow-up  ? Depression  ? Hypertension  ? Weight Loss  ? ? ?HPI ?Deanna Howe presents for a follow-up visit for prediabetes, hypertension and weight loss.  Her A1c was 5.6 today which is within normal range and improved from her previous A1c in November.  Her blood pressure is well controlled with current medications.  She was initially wanting to try Wegovy to help with weight loss instead of Rybelsus but is requesting Ozempic instead.  4-week sample provided to patient.  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 ? ? ?Current Medication: ?Outpatient Encounter Medications as of 06/26/2021  ?Medication Sig  ? buPROPion (WELLBUTRIN XL) 300 MG 24 hr tablet Take 1 tablet (300 mg total) by mouth daily.  ? doxycycline (ADOXA) 100 MG tablet Take 1 tablet (100 mg total) by mouth 2 (two) times daily.  ? ergocalciferol (VITAMIN D2) 1.25 MG (50000 UT) capsule Take 1 capsule (50,000 Units total) by mouth once a week.  ? hydrochlorothiazide (HYDRODIURIL) 25 MG tablet Take 2 tablets (50 mg total) by mouth daily.  ? linaclotide (LINZESS) 145 MCG CAPS capsule Take 1 capsule (145 mcg total) by mouth daily before breakfast.  ? Multiple Vitamins-Minerals (MULTIVITAMIN WITH MINERALS) tablet Take 1 tablet by mouth daily.  ? pantoprazole (PROTONIX) 40 MG tablet Take 1 tablet (40 mg total) by mouth daily.  ? polyethylene glycol powder (MIRALAX) 17 GM/SCOOP powder Use 1 scoop twice a day for about a week and then cut back to once a day after the week or after the first soft bowel movement.  That should give you a good bowel movement fairly soon.  ? [START ON  07/17/2021] Semaglutide,0.25 or 0.5MG /DOS, 2 MG/1.5ML SOPN Inject 0.5 mg into the skin once a week.  ? venlafaxine XR (EFFEXOR-XR) 75 MG 24 hr capsule Take 2 capsules (150 mg total) by mouth daily with breakfast.  ? [DISCONTINUED] Semaglutide (RYBELSUS) 3 MG TABS Take 3 mg by mouth daily before breakfast. on empty stomach, with no more than 4 oz of water, wait 30 minutes before taking other medications, food or water.  ? ?No facility-administered encounter medications on file as of 06/26/2021.  ? ? ?Surgical History: ?Past Surgical History:  ?Procedure Laterality Date  ? ABDOMINAL HYSTERECTOMY    ? APPENDECTOMY    ? CESAREAN SECTION    ? CHOLECYSTECTOMY    ? INCISION AND DRAINAGE Left 09/06/2012  ? Procedure: INCISION AND DRAINAGE;  Surgeon: Zelphia Cairo, MD;  Location: [facility];  Service: Gynecology;  Laterality: Left;  of ovarian cyst  ? LAPAROSCOPY N/A 09/06/2012  ? Procedure: LAPAROSCOPY OPERATIVE;  Surgeon: Zelphia Cairo, MD;  Location: [facility];  Service: Gynecology;  Laterality: N/A;  ? OOPHORECTOMY    ? RT side  ? ? ?Medical History: ?Past Medical History:  ?Diagnosis Date  ? Depression   ? Hypertension   ? Peptic ulcer due to Helicobacter pylori   ? ? ?Family History: ?Family History  ?Problem Relation Age of Onset  ? Hypertension Mother   ? Diabetes Mother   ? ? ?Social History  ? ?Socioeconomic History  ?  Marital status: Married  ?  Spouse name: Not on file  ? Number of children: Not on file  ? Years of education: Not on file  ? Highest education level: Not on file  ?Occupational History  ? Not on file  ?Tobacco Use  ? Smoking status: Never  ? Smokeless tobacco: Never  ?Vaping Use  ? Vaping Use: Never used  ?Substance and Sexual Activity  ? Alcohol use: No  ? Drug use: No  ? Sexual activity: Not Currently  ?Other Topics Concern  ? Not on file  ?Social History Narrative  ? Not on file  ? ?Social Determinants of Health  ? ?Financial Resource Strain: Not on file  ?Food Insecurity: Not on file  ?Transportation  Needs: Not on file  ?Physical Activity: Not on file  ?Stress: Not on file  ?Social Connections: Not on file  ?Intimate Partner Violence: Not on file  ? ? ? ? ?Review of Systems  ?Constitutional:  Negative for chills, fatigue and unexpected weight change.  ?HENT:  Negative for congestion, rhinorrhea, sneezing and sore throat.   ?Eyes:  Negative for redness.  ?Respiratory:  Negative for cough, chest tightness and shortness of breath.   ?Cardiovascular:  Negative for chest pain and palpitations.  ?Gastrointestinal:  Negative for abdominal pain, constipation, diarrhea, nausea and vomiting.  ?Genitourinary:  Negative for dysuria and frequency.  ?Musculoskeletal:  Negative for arthralgias, back pain, joint swelling and neck pain.  ?Skin:  Negative for rash.  ?Neurological: Negative.  Negative for tremors and numbness.  ?Hematological:  Negative for adenopathy. Does not bruise/bleed easily.  ?Psychiatric/Behavioral:  Negative for behavioral problems (Depression), sleep disturbance and suicidal ideas. The patient is not nervous/anxious.   ? ?Vital Signs: ?BP 120/69   Pulse 88   Temp 98.4 ?F (36.9 ?C)   Resp 16   Ht 5\' 10"  (1.778 m)   Wt 271 lb 9.6 oz (123.2 kg)   SpO2 98%   BMI 38.97 kg/m?  ? ? ?Physical Exam ?Vitals reviewed.  ?Constitutional:   ?   General: She is not in acute distress. ?   Appearance: Normal appearance. She is obese. She is not ill-appearing.  ?HENT:  ?   Head: Normocephalic and atraumatic.  ?Eyes:  ?   Pupils: Pupils are equal, round, and reactive to light.  ?Cardiovascular:  ?   Rate and Rhythm: Normal rate and regular rhythm.  ?Pulmonary:  ?   Effort: Pulmonary effort is normal. No respiratory distress.  ?Neurological:  ?   Mental Status: She is alert and oriented to person, place, and time.  ?Psychiatric:     ?   Mood and Affect: Mood normal.     ?   Behavior: Behavior normal.  ? ? ? ? ? ?Assessment/Plan: ?1. Prediabetes ?A1c continues to improve, patient want to try Ozempic to keep her A1c  down and provide some aid in weight loss.  Rybelsus discontinued, sample provided of Ozempic, first prescription sent to be filled in 3 weeks at the 0.5 mg dose. ?- POCT glycosylated hemoglobin (Hb A1C) ?- Semaglutide,0.25 or 0.5MG /DOS, 2 MG/1.5ML SOPN; Inject 0.5 mg into the skin once a week.  Dispense: 1.5 mL; Refill: 2 ? ?2. Essential hypertension ?Blood pressure is well controlled with current medications. ? 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  First prescription of Ozempic 0.5 mg dose will be filled in 3 weeks.  We will follow-up in 1 month for weight loss management. ?- Semaglutide,0.25 or 0.5MG /DOS, 2 MG/1.5ML SOPN; Inject 0.5 mg into the skin once a week.  Dispense: 1.5 mL; Refill: 2 ? ? ?General Counseling: Deanna Howe verbalizes understanding of the findings of todays visit and agrees with plan of treatment. I have discussed any further diagnostic evaluation that may be needed or ordered today. We also reviewed her medications today. she has been encouraged to call the office with any questions or concerns that should arise related to todays visit. ? ? ? ?Orders Placed This Encounter  ?Procedures  ? POCT glycosylated hemoglobin (Hb A1C)  ? ? ?Meds ordered this encounter  ?Medications  ? Semaglutide,0.25 or 0.5MG /DOS, 2 MG/1.5ML SOPN  ?  Sig: Inject 0.5 mg into the skin once a week.  ?  Dispense:  1.5 mL  ?  Refill:  2  ? ? ?Return in about 1 month (around 07/27/2021) for F/U, Weight loss, Dalayza Zambrana PCP. ? ? ?Total time spent:30 Minutes ?Time spent includes review of chart, medications, test results, and follow up plan with the patient.  ? ?[place] Controlled Substance Database was reviewed by me. ? ?This patient was seen by Sallyanne Kuster, FNP-C in collaboration with Dr. Beverely Risen as a part of collaborative care agreement. ? ? ?Snigdha Howser R. Tedd Sias, MSN, FNP-C ?Internal medicine  ?

## 2021-07-12 ENCOUNTER — Encounter: Payer: Self-pay | Admitting: Gastroenterology

## 2021-07-15 ENCOUNTER — Ambulatory Visit
Admission: RE | Admit: 2021-07-15 | Discharge: 2021-07-15 | Disposition: A | Discharge status: Home / Self Care | Payer: BC Managed Care – PPO | Attending: Gastroenterology | Admitting: Gastroenterology

## 2021-07-15 ENCOUNTER — Ambulatory Visit: Payer: BC Managed Care – PPO | Admitting: Certified Registered Nurse Anesthetist

## 2021-07-15 ENCOUNTER — Encounter
Admission: RE | Disposition: A | Discharge status: Home / Self Care | Payer: Self-pay | Source: Home / Self Care | Attending: Gastroenterology

## 2021-07-15 ENCOUNTER — Encounter: Payer: Self-pay | Admitting: Gastroenterology

## 2021-07-15 DIAGNOSIS — F32A Depression, unspecified: Secondary | ICD-10-CM | POA: Insufficient documentation

## 2021-07-15 DIAGNOSIS — K5900 Constipation, unspecified: Secondary | ICD-10-CM | POA: Diagnosis present

## 2021-07-15 DIAGNOSIS — K219 Gastro-esophageal reflux disease without esophagitis: Secondary | ICD-10-CM | POA: Insufficient documentation

## 2021-07-15 DIAGNOSIS — Z1211 Encounter for screening for malignant neoplasm of colon: Secondary | ICD-10-CM | POA: Diagnosis present

## 2021-07-15 DIAGNOSIS — Z9049 Acquired absence of other specified parts of digestive tract: Secondary | ICD-10-CM | POA: Diagnosis not present

## 2021-07-15 DIAGNOSIS — K2289 Other specified disease of esophagus: Secondary | ICD-10-CM | POA: Diagnosis not present

## 2021-07-15 DIAGNOSIS — Z6837 Body mass index (BMI) 37.0-37.9, adult: Secondary | ICD-10-CM | POA: Diagnosis not present

## 2021-07-15 DIAGNOSIS — K5909 Other constipation: Secondary | ICD-10-CM | POA: Insufficient documentation

## 2021-07-15 DIAGNOSIS — Z8371 Family history of colonic polyps: Secondary | ICD-10-CM | POA: Diagnosis not present

## 2021-07-15 DIAGNOSIS — R1013 Epigastric pain: Secondary | ICD-10-CM | POA: Insufficient documentation

## 2021-07-15 DIAGNOSIS — Z8619 Personal history of other infectious and parasitic diseases: Secondary | ICD-10-CM | POA: Insufficient documentation

## 2021-07-15 DIAGNOSIS — E669 Obesity, unspecified: Secondary | ICD-10-CM | POA: Diagnosis not present

## 2021-07-15 DIAGNOSIS — I10 Essential (primary) hypertension: Secondary | ICD-10-CM | POA: Insufficient documentation

## 2021-07-15 HISTORY — PX: COLONOSCOPY WITH PROPOFOL: SHX5780

## 2021-07-15 HISTORY — PX: ESOPHAGOGASTRODUODENOSCOPY (EGD) WITH PROPOFOL: SHX5813

## 2021-07-15 HISTORY — DX: Prediabetes: R73.03

## 2021-07-15 SURGERY — COLONOSCOPY WITH PROPOFOL
Anesthesia: General

## 2021-07-15 MED ORDER — LIDOCAINE HCL (CARDIAC) PF 100 MG/5ML IV SOSY
PREFILLED_SYRINGE | INTRAVENOUS | Status: DC | PRN
  Administered 2021-07-15: 50 mg via INTRAVENOUS

## 2021-07-15 MED ORDER — DEXMEDETOMIDINE (PRECEDEX) IN NS 20 MCG/5ML (4 MCG/ML) IV SYRINGE
PREFILLED_SYRINGE | INTRAVENOUS | Status: DC | PRN
  Administered 2021-07-15: 8 ug via INTRAVENOUS

## 2021-07-15 MED ORDER — SODIUM CHLORIDE 0.9 % IV SOLN
INTRAVENOUS | Status: DC
  Administered 2021-07-15: 1000 mL via INTRAVENOUS

## 2021-07-15 MED ORDER — PROPOFOL 10 MG/ML IV BOLUS
INTRAVENOUS | Status: DC | PRN
  Administered 2021-07-15: 30 mg via INTRAVENOUS
  Administered 2021-07-15: 70 mg via INTRAVENOUS

## 2021-07-15 MED ORDER — PROPOFOL 500 MG/50ML IV EMUL
INTRAVENOUS | Status: DC | PRN
Start: 2021-07-15 — End: 2021-07-15
  Administered 2021-07-15: 150 ug/kg/min via INTRAVENOUS

## 2021-07-15 NOTE — Op Note (Addendum)
[facility] ?Gastroenterology ?Patient Name: Deanna Howe ?Procedure Date: 07/15/2021 11:03 AM ?MRN: [id number] ?Account #: [id number] ?Date of Birth: 10/03/1970 ?Admit Type: Outpatient ?Age: 51 ?Room: [facility] ENDO ROOM 2 ?Gender: Female ?Note Status: Supervisor Override ?Instrument Name: Colonscope [id number] ?Procedure:             Colonoscopy ?Indications:           Constipation ?Providers:             Annamaria Helling DO, DO ?Referring MD:          Jonetta Osgood (Referring MD) ?Medicines:             Monitored Anesthesia Care ?Complications:         No immediate complications. Estimated blood loss: None. ?Procedure:             Pre-Anesthesia Assessment: ?                       - Prior to the procedure, a History and Physical was  ?                       performed, and patient medications and allergies were  ?                       reviewed. The patient is competent. The risks and  ?                       benefits of the procedure and the sedation options and  ?                       risks were discussed with the patient. All questions  ?                       were answered and informed consent was obtained.  ?                       Patient identification and proposed procedure were  ?                       verified by the physician, the nurse, the anesthetist  ?                       and the technician in the endoscopy suite. Mental  ?                       Status Examination: alert and oriented. Airway  ?                       Examination: normal oropharyngeal airway and neck  ?                       mobility. Respiratory Examination: clear to  ?                       auscultation. CV Examination: RRR, no murmurs, no S3  ?                       or S4. Prophylactic Antibiotics: The patient does not  ?  require prophylactic antibiotics. Prior  ?                       Anticoagulants: The patient has taken no previous  ?                       anticoagulant or antiplatelet  agents. ASA Grade  ?                       Assessment: II - A patient with mild systemic disease.  ?                       After reviewing the risks and benefits, the patient  ?                       was deemed in satisfactory condition to undergo the  ?                       procedure. The anesthesia plan was to use monitored  ?                       anesthesia care (MAC). Immediately prior to  ?                       administration of medications, the patient was  ?                       re-assessed for adequacy to receive sedatives. The  ?                       heart rate, respiratory rate, oxygen saturations,  ?                       blood pressure, adequacy of pulmonary ventilation, and  ?                       response to care were monitored throughout the  ?                       procedure. The physical status of the patient was  ?                       re-assessed after the procedure. ?                       After obtaining informed consent, the colonoscope was  ?                       passed under direct vision. Throughout the procedure,  ?                       the patient's blood pressure, pulse, and oxygen  ?                       saturations were monitored continuously. The  ?                       Colonoscope was introduced through the anus and  ?  advanced to the the cecum, identified by appendiceal  ?                       orifice and ileocecal valve. The colonoscopy was  ?                       performed without difficulty. The patient tolerated  ?                       the procedure well. The quality of the bowel  ?                       preparation was evaluated using the BBPS [facility] Bowel  ?                       Preparation Scale) with scores of: Right Colon = 2  ?                       (minor amount of residual staining, small fragments of  ?                       stool and/or opaque liquid, but mucosa seen well),  ?                       Transverse Colon = 3 (entire  mucosa seen well with no  ?                       residual staining, small fragments of stool or opaque  ?                       liquid) and Left Colon = 3 (entire mucosa seen well  ?                       with no residual staining, small fragments of stool or  ?                       opaque liquid). The total BBPS score equals 8. The  ?                       quality of the bowel preparation was excellent. The  ?                       ileocecal valve, appendiceal orifice, and rectum were  ?                       photographed. ?Findings: ?     The perianal and digital rectal examinations were normal. Pertinent  ?     negatives include normal sphincter tone. ?     The entire examined colon appeared normal on direct and retroflexion  ?     views. ?Impression:            - The entire examined colon is normal on direct and  ?                       retroflexion views. ?                       -  No specimens collected. ?Recommendation:        - Discharge patient to home. ?                       - Resume previous diet. ?                       - Continue present medications. ?                       - Repeat colonoscopy in 5 years for screening purposes. ?                       - Return to referring physician as previously  ?                       scheduled. ?                       - The findings and recommendations were discussed with  ?                       the patient. ?Procedure Code(s):     --- Professional --- ?                       [phone], Colonoscopy, flexible; diagnostic, including  ?                       collection of specimen(s) by brushing or washing, when  ?                       performed (separate procedure) ?Diagnosis Code(s):     --- Professional --- ?                       Z12.11, Encounter for screening for malignant neoplasm  ?                       of colon ?CPT copyright 2019 American Medical Association. All rights reserved. ?The codes documented in this report are preliminary and upon coder review may   ?be revised to meet current compliance requirements. ?Attending Participation: ?     I personally performed the entire procedure. ?Volney American, DO ?Annamaria Helling DO, DO ?07/15/2021 11:43:09 AM ?This report has been signed electronically. ?Number of Addenda: 0 ?Note Initiated On: 07/15/2021 11:03 AM ?Scope Withdrawal Time: 0 hours 12 minutes 59 seconds  ?Total Procedure Duration: 0 hours 18 minutes 16 seconds  ?Estimated Blood Loss:  Estimated blood loss: none. ?     [facility] ?

## 2021-07-15 NOTE — H&P (Signed)
? ?Pre-Procedure H&P ?  ?Patient ID: Deanna Howe is a 51 y.o. female. ? ?Gastroenterology Provider: Jaynie Collins, DO ? ?Referring Provider: Fransico Setters, NP ?PCP: Sallyanne Kuster, NP ? ?Date: 07/15/2021 ? ?HPI ?[name] is a 51 y.o. female who presents today for Esophagogastroduodenoscopy and Colonoscopy for Epigastric pain, GERD, history of H. pylori related ulcer, family history of colon polyps. ? ?Patient with ongoing epigastric pain both with eating and without.  She has a history of H. pylori ulcer in 2011 with H. pylori s/p treatment. Recent breath testing was negative (unclear if she was on ppi) at that time.  Has ongoing reflux.  Not currently taking Protonix.  No dysphagia or odynophagia. Appetite and weight have been stable ? ?No melena or hematochezia.  Deals with chronic constipation.  No relief of abdominal pain with bowel movement.She has trialed Linzess with limited results. ? ?Notes increasing fatigue with her constipation over the last 3 months.  Status post appendectomy and cholecystectomy and partial hysterectomy ? ?H. pylori breath testing and celiac testing have been negative.  KUB demonstrated large stool burden ? ?Most recent labs hemoglobin 12.9 MCV 88 platelets 373,000 creatinine 0.9 ? ?CT in October 2022 with constipation but otherwise normal from GI perspective ? ?Past Medical History:  ?Diagnosis Date  ? Depression   ? Hypertension   ? Peptic ulcer due to Helicobacter pylori   ? Pre-diabetes   ? ? ?Past Surgical History:  ?Procedure Laterality Date  ? ABDOMINAL HYSTERECTOMY    ? APPENDECTOMY    ? CESAREAN SECTION    ? CHOLECYSTECTOMY    ? INCISION AND DRAINAGE Left 09/06/2012  ? Procedure: INCISION AND DRAINAGE;  Surgeon: Zelphia Cairo, MD;  Location: [facility];  Service: Gynecology;  Laterality: Left;  of ovarian cyst  ? LAPAROSCOPY N/A 09/06/2012  ? Procedure: LAPAROSCOPY OPERATIVE;  Surgeon: Zelphia Cairo, MD;  Location: [facility];  Service: Gynecology;  Laterality: N/A;   ? OOPHORECTOMY    ? RT side  ? ? ?Family History ?Mother-history of colon polyps and diverticulosis ?Son -Crohn's disease ?No h/o GI disease or malignancy ? ?Review of Systems  ?Constitutional:  Positive for fatigue. Negative for activity change, appetite change, chills, diaphoresis, fever and unexpected weight change.  ?HENT:  Negative for trouble swallowing and voice change.   ?Respiratory:  Negative for shortness of breath and wheezing.   ?Cardiovascular:  Negative for chest pain, palpitations and leg swelling.  ?Gastrointestinal:  Positive for abdominal pain and constipation. Negative for abdominal distention, anal bleeding, blood in stool, diarrhea, nausea, rectal pain and vomiting.  ?     Reflux  ?Musculoskeletal:  Negative for arthralgias and myalgias.  ?Skin:  Negative for color change and pallor.  ?Neurological:  Negative for dizziness, syncope and weakness.  ?Psychiatric/Behavioral:  Negative for confusion.   ?All other systems reviewed and are negative.  ? ?Medications ?No current facility-administered medications on file prior to encounter.  ? ?Current Outpatient Medications on File Prior to Encounter  ?Medication Sig Dispense Refill  ? buPROPion (WELLBUTRIN XL) 300 MG 24 hr tablet Take 1 tablet (300 mg total) by mouth daily. 90 tablet 1  ? hydrochlorothiazide (HYDRODIURIL) 25 MG tablet Take 2 tablets (50 mg total) by mouth daily. 180 tablet 1  ? linaclotide (LINZESS) 145 MCG CAPS capsule Take 1 capsule (145 mcg total) by mouth daily before breakfast. 30 capsule 0  ? Multiple Vitamins-Minerals (MULTIVITAMIN WITH MINERALS) tablet Take 1 tablet by mouth daily.    ?  polyethylene glycol powder (MIRALAX) 17 GM/SCOOP powder Use 1 scoop twice a day for about a week and then cut back to once a day after the week or after the first soft bowel movement.  That should give you a good bowel movement fairly soon. 255 g 0  ? venlafaxine XR (EFFEXOR-XR) 75 MG 24 hr capsule Take 2 capsules (150 mg total) by mouth daily  with breakfast. 180 capsule 1  ? pantoprazole (PROTONIX) 40 MG tablet Take 1 tablet (40 mg total) by mouth daily. (Patient not taking: Reported on 07/15/2021) 90 tablet 1  ? ? ?Pertinent medications related to GI and procedure were reviewed by me with the patient prior to the procedure ? ? ?Current Facility-Administered Medications:  ?  0.9 %  sodium chloride infusion, , Intravenous, Continuous, Jaynie Collins, DO, Last Rate: 20 mL/hr at 07/15/21 1047, Restarted at 07/15/21 1054 ?  ? Allergies ?Codein, dilaudid, hydrocodone, latex, pcn, adhesive, morphine- hives ? ?Allergies were reviewed by me prior to the procedure ? ?Objective  ? ? ?Vitals:  ? 07/15/21 1029  ?BP: (!) 137/96  ?Pulse: 80  ?Resp: 18  ?Temp: (!) 96.7 ?F (35.9 ?C)  ?TempSrc: Temporal  ?SpO2: 100%  ?Weight: 119.1 kg  ?Height: 5\' 10"  (1.778 m)  ? ? ? ?Physical Exam ?Vitals and nursing note reviewed.  ?Constitutional:   ?   General: She is not in acute distress. ?   Appearance: Normal appearance. She is obese. She is not ill-appearing, toxic-appearing or diaphoretic.  ?HENT:  ?   Head: Normocephalic and atraumatic.  ?   Nose: Nose normal.  ?   Mouth/Throat:  ?   Mouth: Mucous membranes are moist.  ?   Pharynx: Oropharynx is clear.  ?Eyes:  ?   General: No scleral icterus. ?   Extraocular Movements: Extraocular movements intact.  ?Cardiovascular:  ?   Rate and Rhythm: Normal rate and regular rhythm.  ?   Heart sounds: Normal heart sounds. No murmur heard. ?  No friction rub. No gallop.  ?Pulmonary:  ?   Effort: Pulmonary effort is normal. No respiratory distress.  ?   Breath sounds: Normal breath sounds. No wheezing, rhonchi or rales.  ?Abdominal:  ?   General: Bowel sounds are normal. There is no distension.  ?   Palpations: Abdomen is soft.  ?   Tenderness: There is no abdominal tenderness. There is no guarding or rebound.  ?Musculoskeletal:  ?   Cervical back: Neck supple.  ?   Right lower leg: No edema.  ?   Left lower leg: No edema.  ?Skin: ?    General: Skin is warm and dry.  ?   Coloration: Skin is not jaundiced or pale.  ?Neurological:  ?   General: No focal deficit present.  ?   Mental Status: She is alert and oriented to person, place, and time. Mental status is at baseline.  ?Psychiatric:     ?   Mood and Affect: Mood normal.     ?   Behavior: Behavior normal.     ?   Thought Content: Thought content normal.     ?   Judgment: Judgment normal.  ? ? ? ?Assessment:  ?[name] is a 51 y.o. female  who presents today for Esophagogastroduodenoscopy and Colonoscopy for Epigastric pain, GERD, history of H. pylori related ulcer, family history of colon polyps. ? ?Plan:  ?Esophagogastroduodenoscopy and Colonoscopy with possible intervention today ? ?Esophagogastroduodenoscopy and Colonoscopy with possible biopsy, control  of bleeding, polypectomy, and interventions as necessary has been discussed with the patient/patient representative. Informed consent was obtained from the patient/patient representative after explaining the indication, nature, and risks of the procedure including but not limited to death, bleeding, perforation, missed neoplasm/lesions, cardiorespiratory compromise, and reaction to medications. Opportunity for questions was given and appropriate answers were provided. Patient/patient representative has verbalized understanding is amenable to undergoing the procedure. ? ? ?Jaynie CollinsSteven Michael Kevina Piloto, DO  ?[facility] ? ?Portions of the record may have been created with voice recognition software. Occasional wrong-word or 'sound-a-like' substitutions may have occurred due to the inherent limitations of voice recognition software.  Read the chart carefully and recognize, using context, where substitutions may have occurred. ?

## 2021-07-15 NOTE — Anesthesia Postprocedure Evaluation (Signed)
Anesthesia Post Note ? ?Patient: Deanna Howe ? ?Procedure(s) Performed: COLONOSCOPY WITH PROPOFOL ?ESOPHAGOGASTRODUODENOSCOPY (EGD) WITH PROPOFOL ? ?Patient location during evaluation: Endoscopy ?Anesthesia Type: General ?Level of consciousness: awake and alert ?Pain management: pain level controlled ?Vital Signs Assessment: post-procedure vital signs reviewed and stable ?Respiratory status: spontaneous breathing, nonlabored ventilation, respiratory function stable and patient connected to nasal cannula oxygen ?Cardiovascular status: blood pressure returned to baseline and stable ?Postop Assessment: no apparent nausea or vomiting ?Anesthetic complications: no ? ? ?No notable events documented. ? ? ?Last Vitals:  ?Vitals:  ? 07/15/21 1201 07/15/21 1211  ?BP: (!) 146/99 (!) 145/93  ?Pulse: 61 60  ?Resp: 14 14  ?Temp:    ?SpO2: 100% 100%  ?  ?Last Pain:  ?Vitals:  ? 07/15/21 1211  ?TempSrc:   ?PainSc: 0-No pain  ? ? ?  ?  ?  ?  ?  ?  ? ?[name] ? ? ? ? ?

## 2021-07-15 NOTE — Interval H&P Note (Signed)
History and Physical Interval Note: Preprocedure H&P from 07/15/21 ? was reviewed and there was no interval change after seeing and examining the patient.  Written consent was obtained from the patient after discussion of risks, benefits, and alternatives. Patient has consented to proceed with Esophagogastroduodenoscopy and Colonoscopy with possible intervention ? ? ?07/15/2021 ?11:00 AM ? ?Deanna Howe  has presented today for surgery, with the diagnosis of K21.9  Gastroesophageal reflux disease, unspecified whether esophagitis present ?R10.13  Epigastric pain ?K59.00  Constipation, unspecified constipation type.  The various methods of treatment have been discussed with the patient and family. After consideration of risks, benefits and other options for treatment, the patient has consented to  Procedure(s): ?COLONOSCOPY WITH PROPOFOL (N/A) ?ESOPHAGOGASTRODUODENOSCOPY (EGD) WITH PROPOFOL (N/A) as a surgical intervention.  The patient's history has been reviewed, patient examined, no change in status, stable for surgery.  I have reviewed the patient's chart and labs.  Questions were answered to the patient's satisfaction.   ? ? ?Annamaria Helling ? ? ?

## 2021-07-15 NOTE — Anesthesia Procedure Notes (Signed)
Date/Time: 07/15/2021 11:04 AM ?Performed by: Ginger Carne, CRNA ?Pre-anesthesia Checklist: Patient identified, Emergency Drugs available, Suction available, Patient being monitored and Timeout performed ?Patient Re-evaluated:Patient Re-evaluated prior to induction ?Oxygen Delivery Method: Nasal cannula ?Preoxygenation: Pre-oxygenation with 100% oxygen ?Induction Type: IV induction ? ? ? ? ?

## 2021-07-15 NOTE — Op Note (Signed)
[facility] ?Gastroenterology ?Patient Name: Deanna Howe ?Procedure Date: 07/15/2021 11:03 AM ?MRN: [id number] ?Account #: [id number] ?Date of Birth: 08-24-1970 ?Admit Type: Outpatient ?Age: 51 ?Room: [facility] ENDO ROOM 2 ?Gender: Female ?Note Status: Finalized ?Instrument Name: Upper Endoscope [id number] ?Procedure:             Upper GI endoscopy ?Indications:           Epigastric abdominal pain, Suspected gastro-esophageal  ?                       reflux disease ?Providers:             Annamaria Helling DO, DO ?Referring MD:          Jonetta Osgood (Referring MD) ?Medicines:             Monitored Anesthesia Care ?Complications:         No immediate complications. Estimated blood loss:  ?                       Minimal. ?Procedure:             Pre-Anesthesia Assessment: ?                       - Prior to the procedure, a History and Physical was  ?                       performed, and patient medications and allergies were  ?                       reviewed. The patient is competent. The risks and  ?                       benefits of the procedure and the sedation options and  ?                       risks were discussed with the patient. All questions  ?                       were answered and informed consent was obtained.  ?                       Patient identification and proposed procedure were  ?                       verified by the physician, the nurse, the anesthetist  ?                       and the technician in the endoscopy suite. Mental  ?                       Status Examination: alert and oriented. Airway  ?                       Examination: normal oropharyngeal airway and neck  ?                       mobility. Respiratory Examination: clear to  ?  auscultation. CV Examination: RRR, no murmurs, no S3  ?                       or S4. Prophylactic Antibiotics: The patient does not  ?                       require prophylactic antibiotics. Prior  ?                        Anticoagulants: The patient has taken no previous  ?                       anticoagulant or antiplatelet agents. ASA Grade  ?                       Assessment: II - A patient with mild systemic disease.  ?                       After reviewing the risks and benefits, the patient  ?                       was deemed in satisfactory condition to undergo the  ?                       procedure. The anesthesia plan was to use monitored  ?                       anesthesia care (MAC). Immediately prior to  ?                       administration of medications, the patient was  ?                       re-assessed for adequacy to receive sedatives. The  ?                       heart rate, respiratory rate, oxygen saturations,  ?                       blood pressure, adequacy of pulmonary ventilation, and  ?                       response to care were monitored throughout the  ?                       procedure. The physical status of the patient was  ?                       re-assessed after the procedure. ?                       After obtaining informed consent, the endoscope was  ?                       passed under direct vision. Throughout the procedure,  ?                       the patient's blood pressure, pulse, and oxygen  ?  saturations were monitored continuously. The  ?                       Endosonoscope was introduced through the mouth, and  ?                       advanced to the second part of duodenum. The upper GI  ?                       endoscopy was accomplished without difficulty. The  ?                       patient tolerated the procedure well. ?Findings: ?     The duodenal bulb, first portion of the duodenum and second portion of  ?     the duodenum were normal. Estimated blood loss: none. ?     The entire examined stomach (on retroflexion) was normal. Biopsies were  ?     taken with a cold forceps for Helicobacter pylori testing. Estimated  ?     blood loss was minimal. ?     The  Z-line was irregular. Biopsies were taken with a cold forceps for  ?     histology. Estimated blood loss was minimal. ?     Esophagogastric landmarks were identified: the gastroesophageal junction  ?     was found at 37 cm from the incisors. ?     The exam of the esophagus was otherwise normal. ?Impression:            - Normal duodenal bulb, first portion of the duodenum  ?                       and second portion of the duodenum. ?                       - Normal stomach. Biopsied. ?                       - Z-line irregular. Biopsied. ?                       - Esophagogastric landmarks identified. ?Recommendation:        - Discharge patient to home. ?                       - Resume previous diet. ?                       - Continue present medications. ?                       - Await pathology results. ?                       - Return to referring physician as previously  ?                       scheduled. ?                       - The findings and recommendations were discussed with  ?  the patient. ?Procedure Code(s):     --- Professional --- ?                       [phone], Esophagogastroduodenoscopy, flexible,  ?                       transoral; with biopsy, single or multiple ?Diagnosis Code(s):     --- Professional --- ?                       K22.8, Other specified diseases of esophagus ?                       R10.13, Epigastric pain ?CPT copyright 2019 American Medical Association. All rights reserved. ?The codes documented in this report are preliminary and upon coder review may  ?be revised to meet current compliance requirements. ?Attending Participation: ?     I personally performed the entire procedure. ?Volney American, DO ?Annamaria Helling DO, DO ?07/15/2021 11:17:00 AM ?This report has been signed electronically. ?Number of Addenda: 0 ?Note Initiated On: 07/15/2021 11:03 AM ?Estimated Blood Loss:  Estimated blood loss was minimal. ?     [facility] ?

## 2021-07-15 NOTE — Transfer of Care (Signed)
Immediate Anesthesia Transfer of Care Note ? ?Patient: Deanna Howe ? ?Procedure(s) Performed: COLONOSCOPY WITH PROPOFOL ?ESOPHAGOGASTRODUODENOSCOPY (EGD) WITH PROPOFOL ? ?Patient Location: PACU ? ?Anesthesia Type:General ? ?Level of Consciousness: sedated ? ?Airway & Oxygen Therapy: Patient Spontanous Breathing ? ?Post-op Assessment: Report given to RN and Post -op Vital signs reviewed and stable ? ?Post vital signs: Reviewed and stable ? ?Last Vitals:  ?Vitals Value Taken Time  ?BP    ?Temp    ?Pulse    ?Resp    ?SpO2    ? ? ?Last Pain:  ?Vitals:  ? 07/15/21 1029  ?TempSrc: Temporal  ?   ? ?  ? ?Complications: No notable events documented. ?

## 2021-07-15 NOTE — Anesthesia Preprocedure Evaluation (Signed)
Anesthesia Evaluation  ?Patient identified by MRN, date of birth, ID band ?Patient awake ? ? ? ?Reviewed: ?Allergy & Precautions, NPO status , Patient's Chart, lab work & pertinent test results ? ?History of Anesthesia Complications ?Negative for: history of anesthetic complications ? ?Airway ?Mallampati: III ? ?TM Distance: >3 FB ?Neck ROM: full ? ? ? Dental ? ?(+) Chipped ?  ?Pulmonary ?neg pulmonary ROS, neg shortness of breath,  ?  ?Pulmonary exam normal ? ? ? ? ? ? ? Cardiovascular ?Exercise Tolerance: Good ?hypertension, (-) angina(-) Past MI Normal cardiovascular exam ? ? ?  ?Neuro/Psych ?PSYCHIATRIC DISORDERS negative neurological ROS ? negative psych ROS  ? GI/Hepatic ?Neg liver ROS, PUD, GERD  Controlled,  ?Endo/Other  ?negative endocrine ROS ? Renal/GU ?negative Renal ROS  ?negative genitourinary ?  ?Musculoskeletal ? ? Abdominal ?  ?Peds ? Hematology ?negative hematology ROS ?(+)   ?Anesthesia Other Findings ?Past Medical History: ?No date: Depression ?No date: Hypertension ?No date: Peptic ulcer due to Helicobacter pylori ?No date: Pre-diabetes ? ?Past Surgical History: ?No date: ABDOMINAL HYSTERECTOMY ?No date: APPENDECTOMY ?No date: CESAREAN SECTION ?No date: CHOLECYSTECTOMY ?09/06/2012: INCISION AND DRAINAGE; Left ?    Comment:  Procedure: INCISION AND DRAINAGE;  Surgeon: Elzie Rings  ?             Julien Girt, MD;  Location: [facility];  Service: Gynecology;   ?             Laterality: Left;  of ovarian cyst ?09/06/2012: LAPAROSCOPY; N/A ?    Comment:  Procedure: LAPAROSCOPY OPERATIVE;  Surgeon: Elzie Rings  ?             Julien Girt, MD;  Location: [facility];  Service: Gynecology;   ?             Laterality: N/A; ?No date: OOPHORECTOMY ?    Comment:  RT side ? ?BMI   ? Body Mass Index: 37.67 kg/m?  ?  ? ? Reproductive/Obstetrics ?negative OB ROS ? ?  ? ? ? ? ? ? ? ? ? ? ? ? ? ?  ?  ? ? ? ? ? ? ? ? ?Anesthesia Physical ?Anesthesia Plan ? ?ASA: 2 ? ?Anesthesia Plan: General  ? ?Post-op Pain  Management:   ? ?Induction: Intravenous ? ?PONV Risk Score and Plan: Propofol infusion and TIVA ? ?Airway Management Planned: Natural Airway and Nasal Cannula ? ?Additional Equipment:  ? ?Intra-op Plan:  ? ?Post-operative Plan:  ? ?Informed Consent: I have reviewed the patients History and Physical, chart, labs and discussed the procedure including the risks, benefits and alternatives for the proposed anesthesia with the patient or authorized representative who has indicated his/her understanding and acceptance.  ? ? ? ?Dental Advisory Given ? ?Plan Discussed with: Anesthesiologist, CRNA and Surgeon ? ?Anesthesia Plan Comments: (Patient consented for risks of anesthesia including but not limited to:  ?- adverse reactions to medications ?- risk of airway placement if required ?- damage to eyes, teeth, lips or other oral mucosa ?- nerve damage due to positioning  ?- sore throat or hoarseness ?- Damage to heart, brain, nerves, lungs, other parts of body or loss of life ? ?Patient voiced understanding.)  ? ? ? ? ? ? ?Anesthesia Quick Evaluation ? ?

## 2021-07-16 ENCOUNTER — Encounter: Payer: Self-pay | Admitting: Gastroenterology

## 2021-07-16 LAB — SURGICAL PATHOLOGY

## 2021-07-31 ENCOUNTER — Ambulatory Visit: Payer: Self-pay | Admitting: Nurse Practitioner

## 2021-07-31 DIAGNOSIS — Z0289 Encounter for other administrative examinations: Secondary | ICD-10-CM

## 2021-09-13 ENCOUNTER — Other Ambulatory Visit: Payer: Self-pay

## 2021-09-13 DIAGNOSIS — E661 Drug-induced obesity: Secondary | ICD-10-CM

## 2021-09-13 DIAGNOSIS — R7303 Prediabetes: Secondary | ICD-10-CM

## 2021-09-13 MED ORDER — SEMAGLUTIDE(0.25 OR 0.5MG/DOS) 2 MG/1.5ML ~~LOC~~ SOPN: 0.5000 mg | PEN_INJECTOR | SUBCUTANEOUS | 0 refills | Status: DC

## 2021-09-13 NOTE — Telephone Encounter (Signed)
Pt called for a refill on OZEMPIC which I sent to pharmacy for 1 month refill with pt needing to make an appt that she missed in April.  Pt informed me that she has no insurance and I gave her the self-pay price of $110.00.  pt will call back to schedule appt.

## 2021-10-07 ENCOUNTER — Telehealth: Payer: Self-pay

## 2021-10-09 ENCOUNTER — Other Ambulatory Visit: Payer: Self-pay | Admitting: Nurse Practitioner

## 2021-10-09 MED ORDER — SEMAGLUTIDE-WEIGHT MANAGEMENT 0.5 MG/0.5ML ~~LOC~~ SOAJ: 0.5000 mg | SUBCUTANEOUS | 2 refills | Status: DC

## 2021-10-09 MED ORDER — SEMAGLUTIDE-WEIGHT MANAGEMENT 0.25 MG/0.5ML ~~LOC~~ SOAJ: 0.2500 mg | SUBCUTANEOUS | 0 refills | Status: DC

## 2021-10-09 NOTE — Telephone Encounter (Signed)
Pt advised that we send wegovy 0.25 for 4 weeks and then wegovy 0.5 mg for 4 week and she need to make appt in 2 months for alyssa for more refills

## 2021-10-11 ENCOUNTER — Other Ambulatory Visit: Payer: Self-pay | Admitting: Nurse Practitioner

## 2021-11-19 ENCOUNTER — Encounter: Payer: Self-pay | Admitting: Nurse Practitioner

## 2022-03-03 ENCOUNTER — Other Ambulatory Visit: Payer: Self-pay

## 2022-03-04 ENCOUNTER — Other Ambulatory Visit: Payer: Self-pay

## 2022-03-04 DIAGNOSIS — F321 Major depressive disorder, single episode, moderate: Secondary | ICD-10-CM

## 2022-03-04 MED ORDER — BUPROPION HCL ER (XL) 300 MG PO TB24: 300.0000 mg | ORAL_TABLET | Freq: Every day | ORAL | 0 refills | Status: DC

## 2022-03-04 NOTE — Telephone Encounter (Signed)
Pt advised that need follow up appt as per alyssa send 30 days Bupropion

## 2022-03-21 ENCOUNTER — Other Ambulatory Visit: Payer: Self-pay | Admitting: Nurse Practitioner

## 2022-05-12 ENCOUNTER — Encounter: Payer: BC Managed Care – PPO | Admitting: Nurse Practitioner

## 2022-06-03 ENCOUNTER — Encounter: Payer: BC Managed Care – PPO | Admitting: Nurse Practitioner

## 2022-10-10 ENCOUNTER — Telehealth: Payer: Self-pay | Admitting: Nurse Practitioner

## 2022-10-10 ENCOUNTER — Ambulatory Visit (INDEPENDENT_AMBULATORY_CARE_PROVIDER_SITE_OTHER): Payer: BC Managed Care – PPO | Admitting: Nurse Practitioner

## 2022-10-10 ENCOUNTER — Encounter: Payer: Self-pay | Admitting: Nurse Practitioner

## 2022-10-10 VITALS — BP 138/84 | HR 80 | Temp 98.1°F | Resp 16 | Ht 69.5 in | Wt 273.4 lb

## 2022-10-10 DIAGNOSIS — R7303 Prediabetes: Secondary | ICD-10-CM | POA: Insufficient documentation

## 2022-10-10 DIAGNOSIS — Z1231 Encounter for screening mammogram for malignant neoplasm of breast: Secondary | ICD-10-CM

## 2022-10-10 DIAGNOSIS — Z0001 Encounter for general adult medical examination with abnormal findings: Secondary | ICD-10-CM

## 2022-10-10 DIAGNOSIS — Z6838 Body mass index (BMI) 38.0-38.9, adult: Secondary | ICD-10-CM

## 2022-10-10 DIAGNOSIS — N951 Menopausal and female climacteric states: Secondary | ICD-10-CM | POA: Diagnosis not present

## 2022-10-10 DIAGNOSIS — F321 Major depressive disorder, single episode, moderate: Secondary | ICD-10-CM

## 2022-10-10 DIAGNOSIS — E661 Drug-induced obesity: Secondary | ICD-10-CM | POA: Insufficient documentation

## 2022-10-10 DIAGNOSIS — L2389 Allergic contact dermatitis due to other agents: Secondary | ICD-10-CM | POA: Diagnosis not present

## 2022-10-10 DIAGNOSIS — R300 Dysuria: Secondary | ICD-10-CM

## 2022-10-10 DIAGNOSIS — I10 Essential (primary) hypertension: Secondary | ICD-10-CM

## 2022-10-10 DIAGNOSIS — E538 Deficiency of other specified B group vitamins: Secondary | ICD-10-CM

## 2022-10-10 LAB — POCT GLYCOSYLATED HEMOGLOBIN (HGB A1C): Hemoglobin A1C: 5.8 % — AB (ref 4.0–5.6)

## 2022-10-10 MED ORDER — HYDROCHLOROTHIAZIDE 25 MG PO TABS: 50.0000 mg | ORAL_TABLET | Freq: Every day | ORAL | 1 refills | Status: DC

## 2022-10-10 MED ORDER — BUPROPION HCL ER (XL) 300 MG PO TB24: 300.0000 mg | ORAL_TABLET | Freq: Every day | ORAL | 0 refills | Status: DC

## 2022-10-10 MED ORDER — ESTRADIOL 0.5 MG PO TABS: 0.5000 mg | ORAL_TABLET | Freq: Every day | ORAL | 2 refills | Status: DC

## 2022-10-10 MED ORDER — TRIAMCINOLONE ACETONIDE 0.1 % EX CREA: 1.0000 | TOPICAL_CREAM | Freq: Two times a day (BID) | CUTANEOUS | 3 refills | Status: AC

## 2022-10-10 MED ORDER — VENLAFAXINE HCL ER 75 MG PO CP24: 150.0000 mg | ORAL_CAPSULE | Freq: Every day | ORAL | 2 refills | Status: DC

## 2022-10-10 NOTE — Progress Notes (Signed)
[facility] [address] [place], [place] [address]  Internal MEDICINE  Office Visit Note  Patient Name: Deanna Howe  [id number]  [id number]  Date of Service: 10/10/2022  Chief Complaint  Patient presents with   Depression   Annual Exam    Mammogram, menopause, depression, itching in face and neck, discuss weight   Hypertension    HPI Deanna Howe presents for an annual well visit and physical exam.  Well-appearing 52 y.o.female with prediabetes, vasomotor symptoms, hypertension, hypersomnia, depression and low vitamin D.  Routine CRC screening: colonoscopy done in march 2023, normal, due in 2033 Routine mammogram: due now  Labs: due for routine labs  New or worsening pain: none, some back pain, just starting back to work. Left hip hurts some, plans to call her orthopedic  Weight --tried wegovy previously, history of prediabetes, rule out type 2 diabetes  Menopause -- not controlled by venlafaxine, having hot flashes, night sweats, mood swings, exhausted all the time, weight gain and hunger Itchy rash on neck and some on face possible allergic reaction. No changes in soaps, detergents or medications. Rash looks like dry mildly scaly macules and scattered skin tags.    Current Medication: Outpatient Encounter Medications as of 10/10/2022  Medication Sig   ergocalciferol (VITAMIN D2) 1.25 MG (50000 UT) capsule Take 1 capsule (50,000 Units total) by mouth once a week.   estradiol (ESTRACE) 0.5 MG tablet Take 1 tablet (0.5 mg total) by mouth daily.   Multiple Vitamins-Minerals (MULTIVITAMIN WITH MINERALS) tablet Take 1 tablet by mouth daily.   polyethylene glycol powder (MIRALAX) 17 GM/SCOOP powder Use 1 scoop twice a day for about a week and then cut back to once a day after the week or after the first soft bowel movement.  That should give you a good bowel movement fairly soon.   triamcinolone cream (KENALOG) 0.1 % Apply 1 Application topically 2 (two) times daily. To affected  areas until resolved.   [DISCONTINUED] buPROPion (WELLBUTRIN XL) 300 MG 24 hr tablet Take 1 tablet (300 mg total) by mouth daily.   [DISCONTINUED] doxycycline (ADOXA) 100 MG tablet Take 1 tablet (100 mg total) by mouth 2 (two) times daily.   [DISCONTINUED] hydrochlorothiazide (HYDRODIURIL) 25 MG tablet TAKE 2 TABLETS BY MOUTH EVERY DAY   [DISCONTINUED] linaclotide (LINZESS) 145 MCG CAPS capsule Take 1 capsule (145 mcg total) by mouth daily before breakfast.   [DISCONTINUED] pantoprazole (PROTONIX) 40 MG tablet Take 1 tablet (40 mg total) by mouth daily.   [DISCONTINUED] Semaglutide-Weight Management 0.25 MG/0.5ML SOAJ Inject 0.25 mg into the skin once a week.   [DISCONTINUED] Semaglutide-Weight Management 0.5 MG/0.5ML SOAJ Inject 0.5 mg into the skin once a week.   [DISCONTINUED] venlafaxine XR (EFFEXOR-XR) 75 MG 24 hr capsule TAKE 2 CAPSULES (150 MG TOTAL) BY MOUTH DAILY WITH BREAKFAST.   buPROPion (WELLBUTRIN XL) 300 MG 24 hr tablet Take 1 tablet (300 mg total) by mouth daily.   hydrochlorothiazide (HYDRODIURIL) 25 MG tablet Take 2 tablets (50 mg total) by mouth daily.   venlafaxine XR (EFFEXOR-XR) 75 MG 24 hr capsule Take 2 capsules (150 mg total) by mouth daily with breakfast.   No facility-administered encounter medications on file as of 10/10/2022.    Surgical History: Past Surgical History:  Procedure Laterality Date   ABDOMINAL HYSTERECTOMY     APPENDECTOMY     CESAREAN SECTION     CHOLECYSTECTOMY     COLONOSCOPY WITH PROPOFOL N/A 07/15/2021   Procedure: COLONOSCOPY WITH PROPOFOL;  Surgeon: Timothy Lasso,  Thomas Hoff, DO;  Location: [facility];  Service: Gastroenterology;  Laterality: N/A;   ESOPHAGOGASTRODUODENOSCOPY (EGD) WITH PROPOFOL N/A 07/15/2021   Procedure: ESOPHAGOGASTRODUODENOSCOPY (EGD) WITH PROPOFOL;  Surgeon: Jaynie Collins, DO;  Location: [facility];  Service: Gastroenterology;  Laterality: N/A;   INCISION AND DRAINAGE Left 09/06/2012   Procedure: INCISION AND  DRAINAGE;  Surgeon: Zelphia Cairo, MD;  Location: [facility];  Service: Gynecology;  Laterality: Left;  of ovarian cyst   LAPAROSCOPY N/A 09/06/2012   Procedure: LAPAROSCOPY OPERATIVE;  Surgeon: Zelphia Cairo, MD;  Location: [facility];  Service: Gynecology;  Laterality: N/A;   OOPHORECTOMY     RT side    Medical History: Past Medical History:  Diagnosis Date   Depression    Hypertension    Peptic ulcer due to Helicobacter pylori    Pre-diabetes     Family History: Family History  Problem Relation Age of Onset   Hypertension Mother    Diabetes Mother     Social History   Socioeconomic History   Marital status: Married    Spouse name: Not on file   Number of children: Not on file   Years of education: Not on file   Highest education level: Not on file  Occupational History   Not on file  Tobacco Use   Smoking status: Never   Smokeless tobacco: Never  Vaping Use   Vaping Use: Never used  Substance and Sexual Activity   Alcohol use: No   Drug use: No   Sexual activity: Not Currently  Other Topics Concern   Not on file  Social History Narrative   Not on file   Social Determinants of Health   Financial Resource Strain: Not on file  Food Insecurity: Not on file  Transportation Needs: Not on file  Physical Activity: Not on file  Stress: Not on file  Social Connections: Not on file  Intimate Partner Violence: Not on file      Review of Systems  Constitutional:  Positive for appetite change, fatigue and unexpected weight change. Negative for activity change, chills and fever.  HENT: Negative.  Negative for congestion, ear pain, rhinorrhea, sore throat and trouble swallowing.   Eyes: Negative.   Respiratory: Negative.  Negative for cough, chest tightness, shortness of breath and wheezing.   Cardiovascular: Negative.  Negative for chest pain and palpitations.  Gastrointestinal: Negative.  Negative for abdominal pain, blood in stool, constipation, diarrhea, nausea and  vomiting.  Endocrine: Positive for polyphagia.  Genitourinary: Negative.  Negative for difficulty urinating, dysuria, frequency, hematuria and urgency.  Musculoskeletal:  Positive for arthralgias and back pain. Negative for joint swelling, myalgias and neck pain.  Skin: Negative.  Negative for rash and wound.  Allergic/Immunologic: Negative.  Negative for immunocompromised state.  Neurological: Negative.  Negative for dizziness, seizures, numbness and headaches.  Hematological: Negative.   Psychiatric/Behavioral:  Positive for behavioral problems and sleep disturbance. Negative for self-injury and suicidal ideas. The patient is nervous/anxious.     Vital Signs: BP 138/84   Pulse 80   Temp 98.1 F (36.7 C)   Resp 16   Ht 5' 9.5" (1.765 m)   Wt 273 lb 6.4 oz (124 kg)   SpO2 99%   BMI 39.80 kg/m    Physical Exam Vitals reviewed.  Constitutional:      General: She is awake. She is not in acute distress.    Appearance: Normal appearance. She is well-developed and well-groomed. She is obese. She is not ill-appearing or diaphoretic.  HENT:     Head: Normocephalic and atraumatic.     Right Ear: Tympanic membrane, ear canal and external ear normal.     Left Ear: Tympanic membrane, ear canal and external ear normal.     Nose: Nose normal. No congestion or rhinorrhea.     Mouth/Throat:     Lips: Pink.     Mouth: Mucous membranes are moist.     Pharynx: Oropharynx is clear. Uvula midline. No oropharyngeal exudate or posterior oropharyngeal erythema.  Eyes:     General: Lids are normal. Vision grossly intact. Gaze aligned appropriately. No scleral icterus.       Right eye: No discharge.        Left eye: No discharge.     Extraocular Movements: Extraocular movements intact.     Conjunctiva/sclera: Conjunctivae normal.     Pupils: Pupils are equal, round, and reactive to light.     Funduscopic exam:    Right eye: Red reflex present.        Left eye: Red reflex present. Neck:      Thyroid: No thyromegaly.     Vascular: No JVD.     Trachea: Trachea and phonation normal. No tracheal deviation.  Cardiovascular:     Rate and Rhythm: Normal rate and regular rhythm.     Pulses: Normal pulses.     Heart sounds: Normal heart sounds, S1 normal and S2 normal. No murmur heard.    No friction rub. No gallop.  Pulmonary:     Effort: Pulmonary effort is normal. No accessory muscle usage or respiratory distress.     Breath sounds: Normal breath sounds and air entry. No stridor. No wheezing or rales.  Chest:     Chest wall: No tenderness.     Comments: Declined clinical breast exam, mammogram ordered.  Abdominal:     General: Bowel sounds are normal. There is no distension.     Palpations: Abdomen is soft. There is no shifting dullness, fluid wave, mass or pulsatile mass.     Tenderness: There is no abdominal tenderness. There is no guarding or rebound.  Musculoskeletal:        General: No tenderness or deformity. Normal range of motion.     Cervical back: Normal range of motion and neck supple.     Right lower leg: No edema.     Left lower leg: No edema.  Lymphadenopathy:     Cervical: No cervical adenopathy.  Skin:    General: Skin is warm and dry.     Capillary Refill: Capillary refill takes less than 2 seconds.     Coloration: Skin is not pale.     Findings: No erythema or rash.  Neurological:     Mental Status: She is alert and oriented to person, place, and time.     Cranial Nerves: No cranial nerve deficit.     Motor: No abnormal muscle tone.     Coordination: Coordination normal.     Gait: Gait normal.     Deep Tendon Reflexes: Reflexes are normal and symmetric.  Psychiatric:        Mood and Affect: Mood and affect normal.        Behavior: Behavior normal. Behavior is cooperative.        Thought Content: Thought content normal.        Judgment: Judgment normal.        Assessment/Plan: 1. Encounter for routine adult health examination with abnormal  findings Age-appropriate preventive screenings and vaccinations  discussed, annual physical exam completed. Routine labs for health maintenance ordered, see below. PHM updated.  - CBC with Differential/Platelet - CMP14+EGFR - Lipid Profile - Vitamin D (25 hydroxy) - B12 and Folate Panel  2. Prediabetes A1c stable at 5.8, routine labs ordered  - POCT glycosylated hemoglobin (Hb A1C) - CBC with Differential/Platelet - CMP14+EGFR - Lipid Profile - Vitamin D (25 hydroxy) - B12 and Folate Panel  3. Essential hypertension Continue HCTZ as prescribed. Routine labs ordered  - hydrochlorothiazide (HYDRODIURIL) 25 MG tablet; Take 2 tablets (50 mg total) by mouth daily.  Dispense: 180 tablet; Refill: 1 - CBC with Differential/Platelet - CMP14+EGFR - Lipid Profile - Vitamin D (25 hydroxy) - B12 and Folate Panel  4. Vasomotor symptoms due to menopause Start estradiol 0.5 mg daily. Follow up in 3 months . - venlafaxine XR (EFFEXOR-XR) 75 MG 24 hr capsule; Take 2 capsules (150 mg total) by mouth daily with breakfast.  Dispense: 180 capsule; Refill: 2 - estradiol (ESTRACE) 0.5 MG tablet; Take 1 tablet (0.5 mg total) by mouth daily.  Dispense: 30 tablet; Refill: 2  5. Allergic contact dermatitis due to other agents Apply triamcinolone cream as prescribed until rash and itching resolved. Call clinic if no improvement  - triamcinolone cream (KENALOG) 0.1 %; Apply 1 Application topically 2 (two) times daily. To affected areas until resolved.  Dispense: 45 g; Refill: 3  6. Dysuria Routine urinalysis done  - Urinalysis, Routine w reflex microscopic  7. Class 2 drug-induced obesity with serious comorbidity and body mass index (BMI) of 38.0 to 38.9 in adult Routine labs ordered, wegovy sample provided, once labs are drawn, will order wegovy prescription.  - CBC with Differential/Platelet - CMP14+EGFR - Lipid Profile - Vitamin D (25 hydroxy) - B12 and Folate Panel  8. Encounter for screening  mammogram for malignant neoplasm of breast Routine screening mammogram ordered  - MM 3D SCREENING MAMMOGRAM BILATERAL BREAST; Future  9. Depression, major, single episode, moderate (HCC) Continue bupropion as prescribed.  - buPROPion (WELLBUTRIN XL) 300 MG 24 hr tablet; Take 1 tablet (300 mg total) by mouth daily.  Dispense: 30 tablet; Refill: 0  10. B12 deficiency B12 lab ordered  - B12 and Folate Panel    General Counseling: Nadeen verbalizes understanding of the findings of todays visit and agrees with plan of treatment. I have discussed any further diagnostic evaluation that may be needed or ordered today. We also reviewed her medications today. she has been encouraged to call the office with any questions or concerns that should arise related to todays visit.    Orders Placed This Encounter  Procedures   MM 3D SCREENING MAMMOGRAM BILATERAL BREAST   CBC with Differential/Platelet   CMP14+EGFR   Lipid Profile   Vitamin D (25 hydroxy)   B12 and Folate Panel   POCT glycosylated hemoglobin (Hb A1C)    Meds ordered this encounter  Medications   buPROPion (WELLBUTRIN XL) 300 MG 24 hr tablet    Sig: Take 1 tablet (300 mg total) by mouth daily.    Dispense:  30 tablet    Refill:  0    Pt need appt for refills   venlafaxine XR (EFFEXOR-XR) 75 MG 24 hr capsule    Sig: Take 2 capsules (150 mg total) by mouth daily with breakfast.    Dispense:  180 capsule    Refill:  2   hydrochlorothiazide (HYDRODIURIL) 25 MG tablet    Sig: Take 2 tablets (50 mg total) by mouth daily.  Dispense:  180 tablet    Refill:  1   triamcinolone cream (KENALOG) 0.1 %    Sig: Apply 1 Application topically 2 (two) times daily. To affected areas until resolved.    Dispense:  45 g    Refill:  3   estradiol (ESTRACE) 0.5 MG tablet    Sig: Take 1 tablet (0.5 mg total) by mouth daily.    Dispense:  30 tablet    Refill:  2    Return in about 4 weeks (around 11/07/2022) for F/U, Weight loss, Labs, Shea Swalley  PCP.   Total time spent:30 Minutes Time spent includes review of chart, medications, test results, and follow up plan with the patient.    Controlled Substance Database was reviewed by me.  This patient was seen by Sallyanne Kuster, FNP-C in collaboration with Dr. Beverely Risen as a part of collaborative care agreement.  Deryl Giroux R. Tedd Sias, MSN, FNP-C Internal medicine

## 2022-10-10 NOTE — Telephone Encounter (Signed)
Mammo order faxed to [facility]

## 2022-10-11 LAB — CMP14+EGFR
ALT: 25 IU/L (ref 0–32)
AST: 21 IU/L (ref 0–40)
Albumin/Globulin Ratio: 1.6
Albumin: 4.2 g/dL (ref 3.8–4.9)
Alkaline Phosphatase: 95 IU/L (ref 44–121)
BUN/Creatinine Ratio: 10 (ref 9–23)
BUN: 10 mg/dL (ref 6–24)
Bilirubin Total: <0.2 mg/dL (ref 0.0–1.2)
CO2: 23 mmol/L (ref 20–29)
Calcium: 8.9 mg/dL (ref 8.7–10.2)
Chloride: 106 mmol/L (ref 96–106)
Creatinine, Ser: 0.96 mg/dL (ref 0.57–1.00)
Globulin, Total: 2.6 g/dL (ref 1.5–4.5)
Glucose: 104 mg/dL — ABNORMAL HIGH (ref 70–99)
Potassium: 4.7 mmol/L (ref 3.5–5.2)
Sodium: 143 mmol/L (ref 134–144)
Total Protein: 6.8 g/dL (ref 6.0–8.5)
eGFR: 71 mL/min/{1.73_m2} (ref >59)

## 2022-10-11 LAB — CBC WITH DIFFERENTIAL/PLATELET
Basophils Absolute: 0 10*3/uL (ref 0.0–0.2)
Basos: 1 %
EOS (ABSOLUTE): 0.3 10*3/uL (ref 0.0–0.4)
Eos: 4 %
Hematocrit: 36.4 % (ref 34.0–46.6)
Hemoglobin: 11.9 g/dL (ref 11.1–15.9)
Immature Grans (Abs): 0 10*3/uL (ref 0.0–0.1)
Immature Granulocytes: 0 %
Lymphocytes Absolute: 2 10*3/uL (ref 0.7–3.1)
Lymphs: 32 %
MCH: 29.2 pg (ref 26.6–33.0)
MCHC: 32.7 g/dL (ref 31.5–35.7)
MCV: 89 fL (ref 79–97)
Monocytes Absolute: 0.6 10*3/uL (ref 0.1–0.9)
Monocytes: 9 %
Neutrophils Absolute: 3.4 10*3/uL (ref 1.4–7.0)
Neutrophils: 54 %
Platelets: 346 10*3/uL (ref 150–450)
RBC: 4.07 x10E6/uL (ref 3.77–5.28)
RDW: 13.1 % (ref 11.7–15.4)
WBC: 6.3 10*3/uL (ref 3.4–10.8)

## 2022-10-11 LAB — LIPID PANEL
Chol/HDL Ratio: 4 ratio (ref 0.0–4.4)
Cholesterol, Total: 191 mg/dL (ref 100–199)
HDL: 48 mg/dL (ref >39)
LDL Chol Calc (NIH): 128 mg/dL — ABNORMAL HIGH (ref 0–99)
Triglycerides: 81 mg/dL (ref 0–149)
VLDL Cholesterol Cal: 15 mg/dL (ref 5–40)

## 2022-10-11 LAB — URINALYSIS, ROUTINE W REFLEX MICROSCOPIC
Bilirubin, UA: NEGATIVE
Glucose, UA: NEGATIVE
Ketones, UA: NEGATIVE
Leukocytes,UA: NEGATIVE
Nitrite, UA: NEGATIVE
Protein,UA: NEGATIVE
RBC, UA: NEGATIVE
Specific Gravity, UA: 1.022 (ref 1.005–1.030)
Urobilinogen, Ur: 0.2 mg/dL (ref 0.2–1.0)
pH, UA: 6 (ref 5.0–7.5)

## 2022-10-11 LAB — VITAMIN D 25 HYDROXY (VIT D DEFICIENCY, FRACTURES): Vit D, 25-Hydroxy: 21.8 ng/mL — ABNORMAL LOW (ref 30.0–100.0)

## 2022-10-11 LAB — B12 AND FOLATE PANEL
Folate: 14.1 ng/mL (ref >3.0)
Vitamin B-12: 543 pg/mL (ref 232–1245)

## 2022-10-22 NOTE — Progress Notes (Signed)
We will discuss results at upcoming office visit.

## 2022-10-29 ENCOUNTER — Telehealth: Payer: Self-pay

## 2022-10-29 DIAGNOSIS — I771 Stricture of artery: Secondary | ICD-10-CM

## 2022-10-31 ENCOUNTER — Emergency Department: Payer: Managed Care, Other (non HMO)

## 2022-10-31 ENCOUNTER — Other Ambulatory Visit: Payer: Self-pay

## 2022-10-31 ENCOUNTER — Emergency Department
Admission: EM | Admit: 2022-10-31 | Discharge: 2022-10-31 | Disposition: A | Discharge status: Home / Self Care | Payer: Managed Care, Other (non HMO) | Attending: Emergency Medicine | Admitting: Emergency Medicine

## 2022-10-31 DIAGNOSIS — R0789 Other chest pain: Secondary | ICD-10-CM | POA: Diagnosis not present

## 2022-10-31 DIAGNOSIS — I10 Essential (primary) hypertension: Secondary | ICD-10-CM | POA: Insufficient documentation

## 2022-10-31 DIAGNOSIS — R079 Chest pain, unspecified: Secondary | ICD-10-CM | POA: Diagnosis present

## 2022-10-31 LAB — BASIC METABOLIC PANEL
Anion gap: 9 (ref 5–15)
BUN: 10 mg/dL (ref 6–20)
CO2: 25 mmol/L (ref 22–32)
Calcium: 8.9 mg/dL (ref 8.9–10.3)
Chloride: 101 mmol/L (ref 98–111)
Creatinine, Ser: 0.88 mg/dL (ref 0.44–1.00)
GFR, Estimated: >60 mL/min (ref >60)
Glucose, Bld: 95 mg/dL (ref 70–99)
Potassium: 3.9 mmol/L (ref 3.5–5.1)
Sodium: 135 mmol/L (ref 135–145)

## 2022-10-31 LAB — CBC
HCT: 38.5 % (ref 36.0–46.0)
Hemoglobin: 12.4 g/dL (ref 12.0–15.0)
MCH: 29.4 pg (ref 26.0–34.0)
MCHC: 32.2 g/dL (ref 30.0–36.0)
MCV: 91.2 fL (ref 80.0–100.0)
Platelets: 317 10*3/uL (ref 150–400)
RBC: 4.22 MIL/uL (ref 3.87–5.11)
RDW: 13.2 % (ref 11.5–15.5)
WBC: 7.1 10*3/uL (ref 4.0–10.5)
nRBC: 0 % (ref 0.0–0.2)

## 2022-10-31 LAB — TROPONIN I (HIGH SENSITIVITY): Troponin I (High Sensitivity): <2 ng/L (ref <18)

## 2022-10-31 LAB — PROTIME-INR
INR: 1.1 (ref 0.8–1.2)
Prothrombin Time: 14.1 seconds (ref 11.4–15.2)

## 2022-10-31 NOTE — ED Provider Notes (Signed)
[facility] Provider Note    Event Date/Time   First MD Initiated Contact with Patient 10/31/22 1517     (approximate)   History   Chest Pain   HPI  Deanna Howe is a 52 y.o. female with a history of hypertension who presents with chest pain over the last few weeks and an abnormal x-ray finding.  The patient states that she has had chest pain over approximately 2 weeks.  The pain is mainly in the center of her chest and sometimes rating up to the left shoulder.  It does not radiate to the back.  She states that usually lasts a few seconds and then resolves.  It is not related to exertion or to any particular position.  She denies associated shortness of breath or lightheadedness.  She is not having any pain currently.  The patient states that a few days ago she had an x-ray done at [facility] and was noted to have an abnormal appearing aorta.  She initially was told to have this checked as an outpatient, but then given the chest pain and was instructed to come to the ED for further evaluation.  I reviewed the past medical records; the most recent outpatient encounter was with primary care on 6/14 for an annual exam.  The report for the chest x-ray performed at [facility] on 7/3 states "tortuous thoracic aorta consistent with clinical history of hypertension" but does not identify mediastinal enlargement or other acute abnormalities.   Physical Exam   Triage Vital Signs: ED Triage Vitals  Enc Vitals Group     BP 10/31/22 1251 (!) 147/97     Pulse Rate 10/31/22 1251 74     Resp 10/31/22 1251 17     Temp 10/31/22 1251 98.4 F (36.9 C)     Temp Source 10/31/22 1251 Oral     SpO2 10/31/22 1251 98 %     Weight 10/31/22 1241 265 lb (120.2 kg)     Height 10/31/22 1241 5\' 9"  (1.753 m)     Head Circumference --      Peak Flow --      Pain Score 10/31/22 1241 5     Pain Loc --      Pain Edu? --      Excl. in GC? --     Most recent vital signs: Vitals:   10/31/22 1251   BP: (!) 147/97  Pulse: 74  Resp: 17  Temp: 98.4 F (36.9 C)  SpO2: 98%     General: Awake, no distress.  CV:  Good peripheral perfusion.  Normal heart sounds. Resp:  Normal effort.  Lungs CTAB. Abd:  No distention.  Other:  No peripheral edema.  ED Results / Procedures / Treatments   Labs (all labs ordered are listed, but only abnormal results are displayed) Labs Reviewed  BASIC METABOLIC PANEL  CBC  PROTIME-INR  TROPONIN I (HIGH SENSITIVITY)     EKG  ED ECG REPORT I, Dionne Bucy, the attending physician, personally viewed and interpreted this ECG.  Date: 10/31/2022 EKG Time: 1246 Rate: 72 Rhythm: normal sinus rhythm QRS Axis: normal Intervals: normal ST/T Wave abnormalities: normal Narrative Interpretation: no evidence of acute ischemia    RADIOLOGY  Chest X-ray: I independently viewed and interpreted the images; there is no focal consolidation or edema.   PROCEDURES:  Critical Care performed: No  Procedures   MEDICATIONS ORDERED IN ED: Medications - No data to display   IMPRESSION / MDM /  ASSESSMENT AND PLAN / ED COURSE  I reviewed the triage vital signs and the nursing notes.  52 year old female with PMH as noted above presents with very atypical intermittent chest pain over the last few weeks and is asymptomatic currently.  X-ray obtained for unrelated reasons at [facility] this week showed a tortuous aorta and she was told to come to the ED to be evaluated.  Physical exam is unremarkable.  Differential diagnosis includes, but is not limited to, musculoskeletal chest wall pain, nerve pain, GERD, other benign etiology.  There is no clinical evidence for aortic dissection, thoracic aortic aneurysm, or other vascular etiology.  The patient has no signs or symptoms to suggest PE.  There is no evidence of ACS.  Initial troponin is negative.  Given the duration of the symptoms there is no indication for repeat.  BMP and CBC are also normal.  Per the  allergy, the chest x-ray here shows an uncoiled aorta that does not appear enlarged.  There is no mediastinal widening.  Patient's presentation is most consistent with acute complicated illness / injury requiring diagnostic workup.  I had an extensive discussion with the patient about the results of the workup so far and possible further eval ration.  At this time, the patient's clinical presentation is not consistent with any acute aortic syndrome.  I discussed with her that we could obtain a CT angiogram to fully rule out any aortic abnormality such as an aneurysm or dissection although the x-ray findings do not specifically suggest any such abnormality.  I offered to obtain this workup to the patient.  However, the patient states that she does not want to get this unless necessary.  She agrees that the pain she is experiencing is mild and otherwise not worrisome to her.  After discussing the radiology reads of both x-rays, she does not want to proceed with further workup.  At this time she is stable for discharge home.  I gave her strict return precautions and she expresses understanding.    FINAL CLINICAL IMPRESSION(S) / ED DIAGNOSES   Final diagnoses:  Atypical chest pain     Rx / DC Orders   ED Discharge Orders     None        Note:  This document was prepared using Dragon voice recognition software and may include unintentional dictation errors.    Dionne Bucy, MD 10/31/22 [phone]

## 2022-10-31 NOTE — ED Notes (Signed)
Delay in EKG due to faulty equipment.

## 2022-10-31 NOTE — Telephone Encounter (Signed)
Patient notified

## 2022-10-31 NOTE — Discharge Instructions (Addendum)
Your X-ray does show an "uncoiled" or curved aorta but no enlargement or signs of an aneurysm (swelling) or a dissection (splitting of the wall of the aorta).  Your chest pain is not consistent with pain related to an aortic problem.  Follow-up with your primary care provider.  Return to the ER immediately for new, worsening, or persistent severe chest pain, difficulty breathing, palpitations, weakness, or any other new or worsening symptoms that concern you.

## 2022-10-31 NOTE — ED Triage Notes (Signed)
Pt sts that she recently got a chest xray for a new job and they noticed that pt had an enlarged aorta. Pt sts that she was to see her PCP but they told her that she is better to come to the ED since she has been experiencing worsening CP. Pt sts that her left shoulder, back and chest have pain.

## 2022-11-03 ENCOUNTER — Telehealth: Payer: Self-pay | Admitting: Nurse Practitioner

## 2022-11-03 NOTE — Telephone Encounter (Signed)
Lvm for [employer] member ID #-Toni

## 2022-11-04 ENCOUNTER — Telehealth: Payer: Self-pay | Admitting: Nurse Practitioner

## 2022-11-04 NOTE — Telephone Encounter (Signed)
S/w patient. She requested to have CT scheduled next month after new insurance is effective-Toni

## 2022-11-06 ENCOUNTER — Ambulatory Visit: Payer: BC Managed Care – PPO | Admitting: Nurse Practitioner

## 2022-11-19 ENCOUNTER — Telehealth: Payer: Self-pay | Admitting: Nurse Practitioner

## 2022-11-19 NOTE — Telephone Encounter (Signed)
Sent message for new insurance information-Deanna Howe

## 2022-12-12 ENCOUNTER — Telehealth: Payer: Self-pay | Admitting: Nurse Practitioner

## 2022-12-12 NOTE — Telephone Encounter (Signed)
Lvm to schedule follow up to review CT angio-Toni

## 2022-12-15 ENCOUNTER — Ambulatory Visit
Admission: RE | Admit: 2022-12-15 | Discharge: 2022-12-15 | Disposition: A | Discharge status: Home / Self Care | Payer: BC Managed Care – PPO | Source: Ambulatory Visit | Attending: Nurse Practitioner | Admitting: Nurse Practitioner

## 2022-12-15 DIAGNOSIS — I771 Stricture of artery: Secondary | ICD-10-CM | POA: Diagnosis present

## 2022-12-15 MED ORDER — IOHEXOL 350 MG/ML SOLN
75.0000 mL | Freq: Once | INTRAVENOUS | Status: AC | PRN
  Administered 2022-12-15: 75 mL via INTRAVENOUS

## 2022-12-17 ENCOUNTER — Ambulatory Visit: Payer: BC Managed Care – PPO | Admitting: Nurse Practitioner

## 2022-12-25 ENCOUNTER — Ambulatory Visit: Payer: BC Managed Care – PPO | Admitting: Nurse Practitioner

## 2022-12-30 ENCOUNTER — Encounter: Payer: Self-pay | Admitting: Nurse Practitioner

## 2022-12-30 ENCOUNTER — Ambulatory Visit: Payer: BC Managed Care – PPO | Admitting: Nurse Practitioner

## 2022-12-30 VITALS — BP 120/78 | HR 87 | Temp 98.3°F | Resp 16 | Ht 69.5 in | Wt 269.2 lb

## 2022-12-30 DIAGNOSIS — N951 Menopausal and female climacteric states: Secondary | ICD-10-CM

## 2022-12-30 DIAGNOSIS — R7303 Prediabetes: Secondary | ICD-10-CM | POA: Diagnosis not present

## 2022-12-30 DIAGNOSIS — R635 Abnormal weight gain: Secondary | ICD-10-CM | POA: Diagnosis not present

## 2022-12-30 DIAGNOSIS — Z6839 Body mass index (BMI) 39.0-39.9, adult: Secondary | ICD-10-CM

## 2022-12-30 MED ORDER — ESTRADIOL 1 MG PO TABS
1.0000 mg | ORAL_TABLET | Freq: Every day | ORAL | 1 refills | Status: DC
Start: 2022-12-30 — End: 2023-07-02

## 2022-12-30 NOTE — Progress Notes (Signed)
[facility] [address] [place], [place] [address]  Internal MEDICINE  Office Visit Note  Patient Name: Deanna Howe  [id number]  [id number]  Date of Service: 12/30/2022  Chief Complaint  Patient presents with   Follow-up    Weight loss    Depression   Hypertension    HPI Deanna Howe presents for a follow-up visit for weight loss and bariatric surgery.  Patient wants a Bariatric referral for gastric sleeve. She has been trying to lose weight with diet and lifestyle changes. She has tried several different medications as well. She also has prediabetes  Also having some vasomotor symptoms and wants to increase estradiol dose.    Current Medication: Outpatient Encounter Medications as of 12/30/2022  Medication Sig   buPROPion (WELLBUTRIN XL) 300 MG 24 hr tablet Take 1 tablet (300 mg total) by mouth daily.   ergocalciferol (VITAMIN D2) 1.25 MG (50000 UT) capsule Take 1 capsule (50,000 Units total) by mouth once a week.   estradiol (ESTRACE) 1 MG tablet Take 1 tablet (1 mg total) by mouth daily.   hydrochlorothiazide (HYDRODIURIL) 25 MG tablet Take 2 tablets (50 mg total) by mouth daily.   Multiple Vitamins-Minerals (MULTIVITAMIN WITH MINERALS) tablet Take 1 tablet by mouth daily.   polyethylene glycol powder (MIRALAX) 17 GM/SCOOP powder Use 1 scoop twice a day for about a week and then cut back to once a day after the week or after the first soft bowel movement.  That should give you a good bowel movement fairly soon.   triamcinolone cream (KENALOG) 0.1 % Apply 1 Application topically 2 (two) times daily. To affected areas until resolved.   venlafaxine XR (EFFEXOR-XR) 75 MG 24 hr capsule Take 2 capsules (150 mg total) by mouth daily with breakfast.   [DISCONTINUED] estradiol (ESTRACE) 0.5 MG tablet Take 1 tablet (0.5 mg total) by mouth daily.   No facility-administered encounter medications on file as of 12/30/2022.    Surgical History: Past Surgical History:  Procedure  Laterality Date   ABDOMINAL HYSTERECTOMY     APPENDECTOMY     CESAREAN SECTION     CHOLECYSTECTOMY     COLONOSCOPY WITH PROPOFOL N/A 07/15/2021   Procedure: COLONOSCOPY WITH PROPOFOL;  Surgeon: Jaynie Collins, DO;  Location: [facility] ENDOSCOPY;  Service: Gastroenterology;  Laterality: N/A;   ESOPHAGOGASTRODUODENOSCOPY (EGD) WITH PROPOFOL N/A 07/15/2021   Procedure: ESOPHAGOGASTRODUODENOSCOPY (EGD) WITH PROPOFOL;  Surgeon: Jaynie Collins, DO;  Location: [facility] ENDOSCOPY;  Service: Gastroenterology;  Laterality: N/A;   INCISION AND DRAINAGE Left 09/06/2012   Procedure: INCISION AND DRAINAGE;  Surgeon: Zelphia Cairo, MD;  Location: [facility] ORS;  Service: Gynecology;  Laterality: Left;  of ovarian cyst   LAPAROSCOPY N/A 09/06/2012   Procedure: LAPAROSCOPY OPERATIVE;  Surgeon: Zelphia Cairo, MD;  Location: [facility] ORS;  Service: Gynecology;  Laterality: N/A;   OOPHORECTOMY     RT side    Medical History: Past Medical History:  Diagnosis Date   Depression    Hypertension    Peptic ulcer due to Helicobacter pylori    Pre-diabetes     Family History: Family History  Problem Relation Age of Onset   Hypertension Mother    Diabetes Mother     Social History   Socioeconomic History   Marital status: Married    Spouse name: Not on file   Number of children: Not on file   Years of education: Not on file   Highest education level: Not on file  Occupational History   Not  on file  Tobacco Use   Smoking status: Never   Smokeless tobacco: Never  Vaping Use   Vaping status: Never Used  Substance and Sexual Activity   Alcohol use: No   Drug use: No   Sexual activity: Not Currently  Other Topics Concern   Not on file  Social History Narrative   Not on file   Social Determinants of Health   Financial Resource Strain: Not on file  Food Insecurity: No Food Insecurity (06/08/2020)   Received from [facility], [facility]   Hunger Vital Sign    Worried About Running Out of Food  in the Last Year: Never true    Ran Out of Food in the Last Year: Never true  Transportation Needs: Not on file  Physical Activity: Not on file  Stress: Not on file  Social Connections: Unknown (11/15/2022)   Received from [facility]   Social Network    Social Network: Not on file  Intimate Partner Violence: Unknown (11/15/2022)   Received from [facility]   HITS    Physically Hurt: Not on file    Insult or Talk Down To: Not on file    Threaten Physical Harm: Not on file    Scream or Curse: Not on file      Review of Systems  Constitutional:  Positive for appetite change, fatigue and unexpected weight change. Negative for activity change, chills and fever.  HENT: Negative.  Negative for congestion, ear pain, rhinorrhea, sore throat and trouble swallowing.   Eyes: Negative.   Respiratory: Negative.  Negative for cough, chest tightness, shortness of breath and wheezing.   Cardiovascular: Negative.  Negative for chest pain and palpitations.  Gastrointestinal: Negative.  Negative for abdominal pain, blood in stool, constipation, diarrhea, nausea and vomiting.  Endocrine: Positive for polyphagia.  Genitourinary: Negative.  Negative for difficulty urinating, dysuria, frequency, hematuria and urgency.  Musculoskeletal:  Positive for arthralgias and back pain. Negative for joint swelling, myalgias and neck pain.  Skin: Negative.  Negative for rash and wound.  Allergic/Immunologic: Negative.  Negative for immunocompromised state.  Neurological: Negative.  Negative for dizziness, seizures, numbness and headaches.  Hematological: Negative.   Psychiatric/Behavioral:  Positive for behavioral problems and sleep disturbance. Negative for self-injury and suicidal ideas. The patient is nervous/anxious.     Vital Signs: BP 120/78   Pulse 87   Temp 98.3 F (36.8 C)   Resp 16   Ht 5' 9.5" (1.765 m)   Wt 269 lb 3.2 oz (122.1 kg)   SpO2 96%   BMI 39.18 kg/m    Physical Exam Vitals  reviewed.  Constitutional:      General: She is not in acute distress.    Appearance: Normal appearance. She is obese. She is not ill-appearing.  HENT:     Head: Normocephalic and atraumatic.  Eyes:     Pupils: Pupils are equal, round, and reactive to light.  Cardiovascular:     Rate and Rhythm: Normal rate and regular rhythm.  Pulmonary:     Effort: Pulmonary effort is normal. No respiratory distress.  Neurological:     Mental Status: She is alert and oriented to person, place, and time.  Psychiatric:        Mood and Affect: Mood normal.        Behavior: Behavior normal.        Assessment/Plan: 1. Prediabetes Referred to bariatric surgery - Amb Referral to Bariatric Surgery  2. Vasomotor symptoms due to menopause Increase estradiol  dose, take as prescribed.  - estradiol (ESTRACE) 1 MG tablet; Take 1 tablet (1 mg total) by mouth daily.  Dispense: 90 tablet; Refill: 1  3. Abnormal weight gain Referred to bariatric surgery  - Amb Referral to Bariatric Surgery  4. Class 2 severe obesity due to excess calories with serious comorbidity and body mass index (BMI) of 39.0 to 39.9 in adult [facility]) Referred to bariatric surgery  - Amb Referral to Bariatric Surgery   General Counseling: Lavida verbalizes understanding of the findings of todays visit and agrees with plan of treatment. I have discussed any further diagnostic evaluation that may be needed or ordered today. We also reviewed her medications today. she has been encouraged to call the office with any questions or concerns that should arise related to todays visit.    Orders Placed This Encounter  Procedures   Amb Referral to Bariatric Surgery    Meds ordered this encounter  Medications   estradiol (ESTRACE) 1 MG tablet    Sig: Take 1 tablet (1 mg total) by mouth daily.    Dispense:  90 tablet    Refill:  1    Return in about 4 months (around 05/01/2023) for F/U, Recheck A1C, Brighid Koch PCP.   Total time spent:30  Minutes Time spent includes review of chart, medications, test results, and follow up plan with the patient.   [place] Controlled Substance Database was reviewed by me.  This patient was seen by Sallyanne Kuster, FNP-C in collaboration with Dr. Beverely Risen as a part of collaborative care agreement.   Zuha Dejonge R. Tedd Sias, MSN, FNP-C Internal medicine

## 2022-12-31 ENCOUNTER — Telehealth: Payer: Self-pay | Admitting: Nurse Practitioner

## 2022-12-31 NOTE — Telephone Encounter (Signed)
Awaiting 12/30/22 office notes for Bariatric Surgery referral-Toni

## 2023-02-06 ENCOUNTER — Encounter: Payer: Self-pay | Admitting: Nurse Practitioner

## 2023-02-09 ENCOUNTER — Ambulatory Visit: Payer: BC Managed Care – PPO | Admitting: Nurse Practitioner

## 2023-03-07 ENCOUNTER — Other Ambulatory Visit: Payer: Self-pay | Admitting: Nurse Practitioner

## 2023-03-07 DIAGNOSIS — F321 Major depressive disorder, single episode, moderate: Secondary | ICD-10-CM

## 2023-03-09 NOTE — Telephone Encounter (Signed)
Please review last done 6 month

## 2023-05-01 ENCOUNTER — Ambulatory Visit: Payer: BC Managed Care – PPO | Admitting: Nurse Practitioner

## 2023-05-05 ENCOUNTER — Other Ambulatory Visit: Payer: Self-pay | Admitting: Nurse Practitioner

## 2023-05-05 DIAGNOSIS — I10 Essential (primary) hypertension: Secondary | ICD-10-CM

## 2023-07-02 ENCOUNTER — Encounter: Payer: Self-pay | Admitting: Nurse Practitioner

## 2023-07-02 ENCOUNTER — Ambulatory Visit (INDEPENDENT_AMBULATORY_CARE_PROVIDER_SITE_OTHER): Admitting: Nurse Practitioner

## 2023-07-02 VITALS — BP 122/74 | HR 62 | Temp 97.8°F | Resp 16 | Ht 69.5 in | Wt 278.6 lb

## 2023-07-02 DIAGNOSIS — E66813 Obesity, class 3: Secondary | ICD-10-CM | POA: Diagnosis not present

## 2023-07-02 DIAGNOSIS — F321 Major depressive disorder, single episode, moderate: Secondary | ICD-10-CM

## 2023-07-02 DIAGNOSIS — N951 Menopausal and female climacteric states: Secondary | ICD-10-CM

## 2023-07-02 DIAGNOSIS — R7303 Prediabetes: Secondary | ICD-10-CM | POA: Diagnosis not present

## 2023-07-02 DIAGNOSIS — Z6841 Body Mass Index (BMI) 40.0 and over, adult: Secondary | ICD-10-CM

## 2023-07-02 DIAGNOSIS — I10 Essential (primary) hypertension: Secondary | ICD-10-CM | POA: Diagnosis not present

## 2023-07-02 LAB — POCT GLYCOSYLATED HEMOGLOBIN (HGB A1C): Hemoglobin A1C: 6 % — AB (ref 4.0–5.6)

## 2023-07-02 MED ORDER — BUPROPION HCL ER (XL) 300 MG PO TB24: 300.0000 mg | ORAL_TABLET | Freq: Every day | ORAL | 1 refills | Status: DC

## 2023-07-02 MED ORDER — SEMAGLUTIDE-WEIGHT MANAGEMENT 0.25 MG/0.5ML ~~LOC~~ SOAJ: 0.2500 mg | SUBCUTANEOUS | 0 refills | Status: DC

## 2023-07-02 MED ORDER — VENLAFAXINE HCL ER 75 MG PO CP24
150.0000 mg | ORAL_CAPSULE | Freq: Every day | ORAL | 1 refills | Status: AC
Start: 2023-07-02 — End: ?

## 2023-07-02 MED ORDER — SEMAGLUTIDE-WEIGHT MANAGEMENT 0.5 MG/0.5ML ~~LOC~~ SOAJ: 0.5000 mg | SUBCUTANEOUS | 5 refills | Status: DC

## 2023-07-02 MED ORDER — ESTRADIOL 0.0375 MG/24HR TD PTWK
0.0375 mg | MEDICATED_PATCH | TRANSDERMAL | 12 refills | Status: AC
Start: 2023-07-02 — End: ?

## 2023-07-02 NOTE — Progress Notes (Signed)
 [facility] [address] [place], [place] [address]  Internal MEDICINE  Office Visit Note  Patient Name: Deanna Howe  [id number]  [id number]  Date of Service: 07/02/2023  Chief Complaint  Patient presents with   Depression   Hypertension   Follow-up    Weight loss     HPI Deanna Howe presents for a follow-up visit for weight loss, prediabetes, vasomotor symptoms, and depression.  Weight loss --- wants to try wegovy  again. Not sure if her current insurance covers it though Prediabetes -- A1c is stable at 6.0  Vasomotor symptoms due to menopause -- wants to try HRT. Interested in the patch Depression -- doing well on venlafaxine .    Current Medication: Outpatient Encounter Medications as of 07/02/2023  Medication Sig   ergocalciferol  (VITAMIN D2) 1.25 MG (50000 UT) capsule Take 1 capsule (50,000 Units total) by mouth once a week.   estradiol  (CLIMARA  - DOSED IN MG/24 HR) 0.0375 mg/24hr patch Place 1 patch (0.0375 mg total) onto the skin once a week.   hydrochlorothiazide  (HYDRODIURIL ) 25 MG tablet TAKE 2 TABLETS BY MOUTH EVERY DAY   Multiple Vitamins-Minerals (MULTIVITAMIN WITH MINERALS) tablet Take 1 tablet by mouth daily.   polyethylene glycol powder (MIRALAX ) 17 GM/SCOOP powder Use 1 scoop twice a day for about a week and then cut back to once a day after the week or after the first soft bowel movement.  That should give you a good bowel movement fairly soon.   triamcinolone  cream (KENALOG ) 0.1 % Apply 1 Application topically 2 (two) times daily. To affected areas until resolved.   [DISCONTINUED] buPROPion  (WELLBUTRIN  XL) 300 MG 24 hr tablet *NEED OFFICE VISIT* TAKE 1 TABLET BY MOUTH EVERY DAY   [DISCONTINUED] estradiol  (ESTRACE ) 1 MG tablet Take 1 tablet (1 mg total) by mouth daily.   [DISCONTINUED] Semaglutide -Weight Management 0.25 MG/0.5ML SOAJ Inject 0.25 mg into the skin once a week.   [DISCONTINUED] Semaglutide -Weight Management 0.5 MG/0.5ML SOAJ Inject 0.5 mg into  the skin once a week.   [DISCONTINUED] venlafaxine  XR (EFFEXOR -XR) 75 MG 24 hr capsule Take 2 capsules (150 mg total) by mouth daily with breakfast.   buPROPion  (WELLBUTRIN  XL) 300 MG 24 hr tablet Take 1 tablet (300 mg total) by mouth daily.   venlafaxine  XR (EFFEXOR -XR) 75 MG 24 hr capsule Take 2 capsules (150 mg total) by mouth daily with breakfast.   No facility-administered encounter medications on file as of 07/02/2023.    Surgical History: Past Surgical History:  Procedure Laterality Date   ABDOMINAL HYSTERECTOMY     APPENDECTOMY     CESAREAN SECTION     CHOLECYSTECTOMY     COLONOSCOPY WITH PROPOFOL  N/A 07/15/2021   Procedure: COLONOSCOPY WITH PROPOFOL ;  Surgeon: Quintin Buckle, DO;  Location: [facility] ENDOSCOPY;  Service: Gastroenterology;  Laterality: N/A;   ESOPHAGOGASTRODUODENOSCOPY (EGD) WITH PROPOFOL  N/A 07/15/2021   Procedure: ESOPHAGOGASTRODUODENOSCOPY (EGD) WITH PROPOFOL ;  Surgeon: Quintin Buckle, DO;  Location: [facility] ENDOSCOPY;  Service: Gastroenterology;  Laterality: N/A;   INCISION AND DRAINAGE Left 09/06/2012   Procedure: INCISION AND DRAINAGE;  Surgeon: Ashby Lawman, MD;  Location: [facility] ORS;  Service: Gynecology;  Laterality: Left;  of ovarian cyst   LAPAROSCOPY N/A 09/06/2012   Procedure: LAPAROSCOPY OPERATIVE;  Surgeon: Ashby Lawman, MD;  Location: [facility] ORS;  Service: Gynecology;  Laterality: N/A;   OOPHORECTOMY     RT side    Medical History: Past Medical History:  Diagnosis Date   Depression    Hypertension  Peptic ulcer due to Helicobacter pylori    Pre-diabetes     Family History: Family History  Problem Relation Age of Onset   Hypertension Mother    Diabetes Mother     Social History   Socioeconomic History   Marital status: Married    Spouse name: Not on file   Number of children: Not on file   Years of education: Not on file   Highest education level: Not on file  Occupational History   Not on file  Tobacco Use   Smoking  status: Never   Smokeless tobacco: Never  Vaping Use   Vaping status: Never Used  Substance and Sexual Activity   Alcohol use: No   Drug use: No   Sexual activity: Not Currently  Other Topics Concern   Not on file  Social History Narrative   Not on file   Social Drivers of Health   Financial Resource Strain: Not on file  Food Insecurity: No Food Insecurity (06/08/2020)   Received from [facility]   Hunger Vital Sign    Worried About Running Out of Food in the Last Year: Never true    Ran Out of Food in the Last Year: Never true  Transportation Needs: Not on file  Physical Activity: Not on file  Stress: Not on file  Social Connections: Unknown (11/15/2022)   Received from [facility]   Social Network    Social Network: Not on file  Intimate Partner Violence: Unknown (11/15/2022)   Received from [facility]   HITS    Physically Hurt: Not on file    Insult or Talk Down To: Not on file    Threaten Physical Harm: Not on file    Scream or Curse: Not on file      Review of Systems  Constitutional:  Positive for appetite change, fatigue and unexpected weight change. Negative for activity change, chills and fever.  HENT: Negative.  Negative for congestion, ear pain, rhinorrhea, sore throat and trouble swallowing.   Eyes: Negative.   Respiratory: Negative.  Negative for cough, chest tightness, shortness of breath and wheezing.   Cardiovascular: Negative.  Negative for chest pain and palpitations.  Gastrointestinal: Negative.  Negative for abdominal pain, blood in stool, constipation, diarrhea, nausea and vomiting.  Endocrine: Positive for polyphagia.  Genitourinary: Negative.  Negative for difficulty urinating, dysuria, frequency, hematuria and urgency.  Musculoskeletal:  Positive for arthralgias and back pain. Negative for joint swelling, myalgias and neck pain.  Skin: Negative.  Negative for rash and wound.  Allergic/Immunologic: Negative.  Negative for  immunocompromised state.  Neurological: Negative.  Negative for dizziness, seizures, numbness and headaches.  Hematological: Negative.   Psychiatric/Behavioral:  Positive for behavioral problems and sleep disturbance. Negative for self-injury and suicidal ideas. The patient is nervous/anxious.     Vital Signs: BP 122/74   Pulse 62   Temp 97.8 F (36.6 C)   Resp 16   Ht 5' 9.5" (1.765 m)   Wt 278 lb 9.6 oz (126.4 kg)   SpO2 95%   BMI 40.55 kg/m    Physical Exam Vitals reviewed.  Constitutional:      General: She is not in acute distress.    Appearance: Normal appearance. She is obese. She is not ill-appearing.  HENT:     Head: Normocephalic and atraumatic.  Eyes:     Pupils: Pupils are equal, round, and reactive to light.  Cardiovascular:     Rate and Rhythm: Normal rate and regular  rhythm.  Pulmonary:     Effort: Pulmonary effort is normal. No respiratory distress.  Neurological:     Mental Status: She is alert and oriented to person, place, and time.  Psychiatric:        Mood and Affect: Mood normal.        Behavior: Behavior normal.        Assessment/Plan: 1. Essential hypertension (Primary) Stable, continue hydrochlorothiazide  as prescribed.   2. Prediabetes A1c is stable at 6.0, continue diet and exercise as previously discussed.  - POCT glycosylated hemoglobin (Hb A1C)  3. Vasomotor symptoms due to menopause Take climara  patch and use as prescribed. Continue venlafaxine  as prescribed.  - estradiol  (CLIMARA  - DOSED IN MG/24 HR) 0.0375 mg/24hr patch; Place 1 patch (0.0375 mg total) onto the skin once a week.  Dispense: 4 patch; Refill: 12 - venlafaxine  XR (EFFEXOR -XR) 75 MG 24 hr capsule; Take 2 capsules (150 mg total) by mouth daily with breakfast.  Dispense: 180 capsule; Refill: 1  4. Class 3 severe obesity due to excess calories with serious comorbidity and body mass index (BMI) of 40.0 to 44.9 in adult [facility]) Wegovy  prescribed, will see if her insurance  will cover. Has an upcoming appt with [facility] bariatric [facility].   5. Depression, major, single episode, moderate (HCC) Continue venlafaxine  and bupropion  as prescribed.  - buPROPion  (WELLBUTRIN  XL) 300 MG 24 hr tablet; Take 1 tablet (300 mg total) by mouth daily.  Dispense: 90 tablet; Refill: 1   General Counseling: Ricquel verbalizes understanding of the findings of todays visit and agrees with plan of treatment. I have discussed any further diagnostic evaluation that may be needed or ordered today. We also reviewed her medications today. she has been encouraged to call the office with any questions or concerns that should arise related to todays visit.    Orders Placed This Encounter  Procedures   POCT glycosylated hemoglobin (Hb A1C)    Meds ordered this encounter  Medications   estradiol  (CLIMARA  - DOSED IN MG/24 HR) 0.0375 mg/24hr patch    Sig: Place 1 patch (0.0375 mg total) onto the skin once a week.    Dispense:  4 patch    Refill:  12   venlafaxine  XR (EFFEXOR -XR) 75 MG 24 hr capsule    Sig: Take 2 capsules (150 mg total) by mouth daily with breakfast.    Dispense:  180 capsule    Refill:  1    Fill new script today   buPROPion  (WELLBUTRIN  XL) 300 MG 24 hr tablet    Sig: Take 1 tablet (300 mg total) by mouth daily.    Dispense:  90 tablet    Refill:  1   DISCONTD: Semaglutide -Weight Management 0.25 MG/0.5ML SOAJ    Sig: Inject 0.25 mg into the skin once a week.    Dispense:  2 mL    Refill:  0    Dx code E66.01, fill new script. Patient to take 0.25 mg dose for 4 doses once a week then increase to 0.5 mg dose.   DISCONTD: Semaglutide -Weight Management 0.5 MG/0.5ML SOAJ    Sig: Inject 0.5 mg into the skin once a week.    Dispense:  2 mL    Refill:  5    Dx code E66.01, start 0.5 mg dose after 4 doses of 0.25 mg weekly.    Return in about 1 month (around 08/02/2023) for F/U, eval new med, Weight loss, Rubin Dais PCP.   Total time spent:30 Minutes Time spent includes  review of  chart, medications, test results, and follow up plan with the patient.   [place] Controlled Substance Database was reviewed by me.  This patient was seen by Laurence Pons, FNP-C in collaboration with Dr. Verneta Gone as a part of collaborative care agreement.   Kaylan Yates R. Bobbi Burow, MSN, FNP-C Internal medicine

## 2023-07-06 ENCOUNTER — Other Ambulatory Visit: Payer: Self-pay

## 2023-07-06 MED ORDER — SEMAGLUTIDE-WEIGHT MANAGEMENT 0.5 MG/0.5ML ~~LOC~~ SOAJ: 0.5000 mg | SUBCUTANEOUS | 5 refills | Status: DC

## 2023-07-06 MED ORDER — SEMAGLUTIDE-WEIGHT MANAGEMENT 0.25 MG/0.5ML ~~LOC~~ SOAJ: 0.2500 mg | SUBCUTANEOUS | 0 refills | Status: DC

## 2023-08-15 ENCOUNTER — Encounter: Payer: Self-pay | Admitting: Nurse Practitioner

## 2023-08-20 ENCOUNTER — Ambulatory Visit
Admission: EM | Admit: 2023-08-20 | Discharge: 2023-08-20 | Disposition: A | Discharge status: Home / Self Care | Attending: Emergency Medicine | Admitting: Emergency Medicine

## 2023-08-20 ENCOUNTER — Encounter: Payer: Self-pay | Admitting: Emergency Medicine

## 2023-08-20 DIAGNOSIS — M546 Pain in thoracic spine: Secondary | ICD-10-CM

## 2023-08-20 MED ORDER — PREDNISONE 10 MG (21) PO TBPK: ORAL_TABLET | ORAL | 0 refills | Status: AC

## 2023-08-20 MED ORDER — DEXAMETHASONE SODIUM PHOSPHATE 10 MG/ML IJ SOLN
10.0000 mg | Freq: Once | INTRAMUSCULAR | Status: AC
  Administered 2023-08-20: 10 mg via INTRAMUSCULAR

## 2023-08-20 MED ORDER — BACLOFEN 10 MG PO TABS: 10.0000 mg | ORAL_TABLET | Freq: Three times a day (TID) | ORAL | 0 refills | Status: AC

## 2023-08-20 NOTE — ED Provider Notes (Signed)
 [facility]    CSN: [id number] Arrival date & time: 08/20/23  1541      History   Chief Complaint Chief Complaint  Patient presents with   Back Pain    HPI Deanna Howe is a 53 y.o. female.   HPI  53 year old female with past medical history significant for prediabetes, peptic ulcer due to H. pylori, hypertension, depression presents for evaluation of midthoracic back pain that has been going on for the last week.  She reports that last night she was at work and bent over to pick up something for patient and twisted wrong which dramatically aggravated the problem.  She has been using Tylenol  and doing stretching exercises at home without any improvement of symptoms.  She denies any falls or injuries.  The pain does not radiate.  Past Medical History:  Diagnosis Date   Depression    Hypertension    Peptic ulcer due to Helicobacter pylori    Pre-diabetes     Patient Active Problem List   Diagnosis Date Noted   Allergic contact dermatitis due to other agents 10/10/2022   Depression, major, single episode, moderate (HCC) 10/10/2022   Essential hypertension 10/10/2022   Prediabetes 10/10/2022   Class 2 drug-induced obesity with serious comorbidity and body mass index (BMI) of 38.0 to 38.9 in adult 10/10/2022   Other hypersomnia 10/19/2020   Vitamin D  deficiency 12/20/2019   Hypercholesterolemia 01/26/2019   Atopic dermatitis 10/06/2017   Esophageal reflux 10/25/2013   Glaucoma 06/09/2013   Attention deficit disorder 02/21/2013   Major depressive disorder 12/31/2012    Past Surgical History:  Procedure Laterality Date   ABDOMINAL HYSTERECTOMY     APPENDECTOMY     CESAREAN SECTION     CHOLECYSTECTOMY     COLONOSCOPY WITH PROPOFOL  N/A 07/15/2021   Procedure: COLONOSCOPY WITH PROPOFOL ;  Surgeon: Quintin Buckle, DO;  Location: [facility] ENDOSCOPY;  Service: Gastroenterology;  Laterality: N/A;   ESOPHAGOGASTRODUODENOSCOPY (EGD) WITH PROPOFOL  N/A 07/15/2021    Procedure: ESOPHAGOGASTRODUODENOSCOPY (EGD) WITH PROPOFOL ;  Surgeon: Quintin Buckle, DO;  Location: [facility] ENDOSCOPY;  Service: Gastroenterology;  Laterality: N/A;   INCISION AND DRAINAGE Left 09/06/2012   Procedure: INCISION AND DRAINAGE;  Surgeon: Ashby Lawman, MD;  Location: [facility] ORS;  Service: Gynecology;  Laterality: Left;  of ovarian cyst   LAPAROSCOPY N/A 09/06/2012   Procedure: LAPAROSCOPY OPERATIVE;  Surgeon: Ashby Lawman, MD;  Location: [facility] ORS;  Service: Gynecology;  Laterality: N/A;   OOPHORECTOMY     RT side    OB History   No obstetric history on file.      Home Medications    Prior to Admission medications   Medication Sig Start Date End Date Taking? Authorizing Provider  baclofen  (LIORESAL ) 10 MG tablet Take 1 tablet (10 mg total) by mouth 3 (three) times daily. 08/20/23  Yes Kent Pear, NP  predniSONE  (STERAPRED UNI-PAK 21 TAB) 10 MG (21) TBPK tablet Take 6 tablets on day 1, 5 tablets day 2, 4 tablets day 3, 3 tablets day 4, 2 tablets day 5, 1 tablet day 6 08/20/23  Yes Kent Pear, NP  buPROPion  (WELLBUTRIN  XL) 300 MG 24 hr tablet Take 1 tablet (300 mg total) by mouth daily. 07/02/23   Laurence Pons, NP  ergocalciferol  (VITAMIN D2) 1.25 MG (50000 UT) capsule Take 1 capsule (50,000 Units total) by mouth once a week. 06/24/21   Abernathy, [name], NP  estradiol  (CLIMARA  - DOSED IN MG/24 HR) 0.0375 mg/24hr patch Place 1 patch (0.0375 mg  total) onto the skin once a week. 07/02/23   Laurence Pons, NP  hydrochlorothiazide  (HYDRODIURIL ) 25 MG tablet TAKE 2 TABLETS BY MOUTH EVERY DAY 05/05/23   Abernathy, [name], NP  Multiple Vitamins-Minerals (MULTIVITAMIN WITH MINERALS) tablet Take 1 tablet by mouth daily.    [provider]  polyethylene glycol powder (MIRALAX ) 17 GM/SCOOP powder Use 1 scoop twice a day for about a week and then cut back to once a day after the week or after the first soft bowel movement.  That should give you a good bowel movement fairly soon.  01/28/21   Malinda, Paul F, MD  Semaglutide -Weight Management 0.25 MG/0.5ML SOAJ Inject 0.25 mg into the skin once a week. 07/06/23   Laurence Pons, NP  Semaglutide -Weight Management 0.5 MG/0.5ML SOAJ Inject 0.5 mg into the skin once a week. 07/06/23   Laurence Pons, NP  triamcinolone  cream (KENALOG ) 0.1 % Apply 1 Application topically 2 (two) times daily. To affected areas until resolved. 10/10/22   Laurence Pons, NP  venlafaxine  XR (EFFEXOR -XR) 75 MG 24 hr capsule Take 2 capsules (150 mg total) by mouth daily with breakfast. 07/02/23   Laurence Pons, NP    Family History Family History  Problem Relation Age of Onset   Hypertension Mother    Diabetes Mother     Social History Social History   Tobacco Use   Smoking status: Never   Smokeless tobacco: Never  Vaping Use   Vaping status: Never Used  Substance Use Topics   Alcohol use: No   Drug use: No     Allergies   Bee venom, Codeine, Dilaudid [hydromorphone hcl], Hydrocodone, Latex, Penicillins, Adhesive [tape], Morphine  and codeine, and Wound dressing adhesive   Review of Systems Review of Systems  Musculoskeletal:  Positive for back pain.  Neurological:  Negative for weakness and numbness.     Physical Exam Triage Vital Signs ED Triage Vitals  Encounter Vitals Group     BP 08/20/23 1617 (!) 127/90     Systolic BP Percentile --      Diastolic BP Percentile --      Pulse Rate 08/20/23 1617 77     Resp 08/20/23 1617 16     Temp 08/20/23 1617 98.5 F (36.9 C)     Temp Source 08/20/23 1617 Oral     SpO2 08/20/23 1617 98 %     Weight --      Height --      Head Circumference --      Peak Flow --      Pain Score 08/20/23 1616 8     Pain Loc --      Pain Education --      Exclude from Growth Chart --    No data found.  Updated Vital Signs BP (!) 127/90 (BP Location: Right Arm)   Pulse 77   Temp 98.5 F (36.9 C) (Oral)   Resp 16   SpO2 98%   Visual Acuity Right Eye Distance:   Left Eye  Distance:   Bilateral Distance:    Right Eye Near:   Left Eye Near:    Bilateral Near:     Physical Exam Vitals and nursing note reviewed.  Constitutional:      Appearance: Normal appearance. She is not ill-appearing.  HENT:     Head: Normocephalic and atraumatic.  Musculoskeletal:        General: Tenderness present.  Skin:    General: Skin is warm and dry.     Capillary  Refill: Capillary refill takes less than 2 seconds.  Neurological:     General: No focal deficit present.     Mental Status: She is alert and oriented to person, place, and time.      UC Treatments / Results  Labs (all labs ordered are listed, but only abnormal results are displayed) Labs Reviewed - No data to display  EKG   Radiology No results found.  Procedures Procedures (including critical care time)  Medications Ordered in UC Medications  dexamethasone  (DECADRON ) injection 10 mg (has no administration in time range)    Initial Impression / Assessment and Plan / UC Course  I have reviewed the triage vital signs and the nursing notes.  Pertinent labs & imaging results that were available during my care of the patient were reviewed by me and considered in my medical decision making (see chart for details).   Patient is a nontoxic-appearing 53 year old female presenting for evaluation of thoracic back pain as outlined HPI above.  No midline spinous process tenderness or step-off.  Bilateral thoracic paraspinous regions are tender to palpation with palpable muscle tension but no overt spasm.  The pain increases with forward flexion, extension, and lateral rotation.  I will treat the patient for thoracic back pain with home physical therapy exercises, moist heat, 6-day prednisone  taper, and baclofen .  Due to the late hour of the day I will have staff administer 10 mg of IM Decadron  in clinic and she can start the prednisone  tomorrow morning.  Return precautions reviewed.  Work note  provided.   Final Clinical Impressions(s) / UC Diagnoses   Final diagnoses:  Acute bilateral thoracic back pain     Discharge Instructions      Take the prednisone  according to the package instructions for the next 2 days for treatment of your back pain.  Take the baclofen , 10 mg every 8 hours, on a schedule for the next 48 hours and then as needed.  Apply moist heat to your back for 30 minutes at a time 2-3 times a day to improve blood flow to the area and help remove the lactic acid causing the spasm.  Follow the back exercises given at discharge.  Return for reevaluation for any new or worsening symptoms.      ED Prescriptions     Medication Sig Dispense Auth. Provider   predniSONE  (STERAPRED UNI-PAK 21 TAB) 10 MG (21) TBPK tablet Take 6 tablets on day 1, 5 tablets day 2, 4 tablets day 3, 3 tablets day 4, 2 tablets day 5, 1 tablet day 6 21 tablet Kent Pear, NP   baclofen  (LIORESAL ) 10 MG tablet Take 1 tablet (10 mg total) by mouth 3 (three) times daily. 30 each Kent Pear, NP      PDMP not reviewed this encounter.   Kent Pear, NP 08/20/23 [phone]

## 2023-08-20 NOTE — ED Triage Notes (Signed)
 Pt presents with lower back pain x 1 week. Pt does a lot of bending over at work. 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 She takes Tylenol  for her symptoms.

## 2023-08-20 NOTE — Discharge Instructions (Addendum)
 Take the prednisone  according to the package instructions for the next 2 days for treatment of your back pain.  Take the baclofen , 10 mg every 8 hours, on a schedule for the next 48 hours and then as needed.  Apply moist heat to your back for 30 minutes at a time 2-3 times a day to improve blood flow to the area and help remove the lactic acid causing the spasm.  Follow the back exercises given at discharge.  Return for reevaluation for any new or worsening symptoms.

## 2023-09-08 ENCOUNTER — Encounter: Payer: Self-pay | Admitting: Nurse Practitioner

## 2023-09-08 MED ORDER — TIRZEPATIDE-WEIGHT MANAGEMENT 5 MG/0.5ML ~~LOC~~ SOAJ: 5.0000 mg | SUBCUTANEOUS | 5 refills | Status: AC

## 2023-09-08 MED ORDER — TIRZEPATIDE-WEIGHT MANAGEMENT 2.5 MG/0.5ML ~~LOC~~ SOAJ: 2.5000 mg | SUBCUTANEOUS | 0 refills | Status: AC

## 2023-10-05 ENCOUNTER — Telehealth: Payer: Self-pay | Admitting: Nurse Practitioner

## 2023-10-05 NOTE — Telephone Encounter (Signed)
 VM not set up, sent mychart message to confirm 10/12/23 appointment-Toni

## 2023-10-12 ENCOUNTER — Encounter: Payer: BC Managed Care – PPO | Admitting: Nurse Practitioner

## 2023-10-15 ENCOUNTER — Telehealth: Payer: Self-pay | Admitting: Nurse Practitioner

## 2023-10-15 NOTE — Telephone Encounter (Signed)
 Lvm to reschedule 10/12/2023 missed appointment-tw /

## 2023-10-19 ENCOUNTER — Telehealth: Payer: Self-pay | Admitting: Nurse Practitioner

## 2023-10-19 NOTE — Telephone Encounter (Signed)
 Left 2nd  vm regarding missed appointment-Toni

## 2023-10-22 ENCOUNTER — Telehealth: Payer: Self-pay | Admitting: Nurse Practitioner

## 2023-10-22 NOTE — Telephone Encounter (Signed)
 Left 3rd vm to schedule missed appointment. Mailed letter-Toni

## 2023-12-01 ENCOUNTER — Other Ambulatory Visit: Payer: Self-pay | Admitting: Nurse Practitioner

## 2023-12-01 DIAGNOSIS — F321 Major depressive disorder, single episode, moderate: Secondary | ICD-10-CM

## 2024-02-22 ENCOUNTER — Other Ambulatory Visit: Payer: Self-pay | Admitting: Nurse Practitioner

## 2024-02-22 DIAGNOSIS — F321 Major depressive disorder, single episode, moderate: Secondary | ICD-10-CM
